# Patient Record
Sex: Female | Born: 1995 | Race: Black or African American | Hispanic: No | Marital: Single | Smoking: Never smoker
Health system: Southern US, Community
[De-identification: ages and names within clinical notes are randomized; demographics above are authoritative.]

## PROBLEM LIST (undated history)

## (undated) DIAGNOSIS — R7303 Prediabetes: Secondary | ICD-10-CM

## (undated) DIAGNOSIS — E282 Polycystic ovarian syndrome: Secondary | ICD-10-CM

## (undated) DIAGNOSIS — J302 Other seasonal allergic rhinitis: Secondary | ICD-10-CM

## (undated) HISTORY — DX: Prediabetes: R73.03

## (undated) HISTORY — PX: NO PAST SURGERIES: SHX2092

---
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
## 2017-05-21 ENCOUNTER — Ambulatory Visit (HOSPITAL_COMMUNITY)
Admission: EM | Admit: 2017-05-21 | Discharge: 2017-05-21 | Disposition: A | Discharge status: Home / Self Care | Payer: Medicaid Other | Attending: Internal Medicine | Admitting: Internal Medicine

## 2017-05-21 ENCOUNTER — Ambulatory Visit (INDEPENDENT_AMBULATORY_CARE_PROVIDER_SITE_OTHER): Payer: Medicaid Other

## 2017-05-21 ENCOUNTER — Encounter (HOSPITAL_COMMUNITY): Payer: Self-pay | Admitting: *Deleted

## 2017-05-21 ENCOUNTER — Other Ambulatory Visit: Payer: Self-pay

## 2017-05-21 DIAGNOSIS — Z3202 Encounter for pregnancy test, result negative: Secondary | ICD-10-CM

## 2017-05-21 DIAGNOSIS — J40 Bronchitis, not specified as acute or chronic: Secondary | ICD-10-CM | POA: Diagnosis not present

## 2017-05-21 LAB — POCT PREGNANCY, URINE: PREG TEST UR: NEGATIVE

## 2017-05-21 MED ORDER — FLUTICASONE PROPIONATE 50 MCG/ACT NA SUSP: 2.0000 | Freq: Every day | NASAL | 0 refills | Status: DC

## 2017-05-21 MED ORDER — PREDNISONE 20 MG PO TABS: 40.0000 mg | ORAL_TABLET | Freq: Every day | ORAL | 0 refills | Status: AC

## 2017-05-21 MED ORDER — CETIRIZINE-PSEUDOEPHEDRINE ER 5-120 MG PO TB12: 1.0000 | ORAL_TABLET | Freq: Every day | ORAL | 0 refills | Status: DC

## 2017-05-21 MED ORDER — AZITHROMYCIN 250 MG PO TABS: 250.0000 mg | ORAL_TABLET | Freq: Every day | ORAL | 0 refills | Status: DC

## 2017-05-21 NOTE — ED Notes (Signed)
Urine placed in lab 

## 2017-05-21 NOTE — Discharge Instructions (Signed)
Chest xray without pneumonia. Start azithromycin and prednisone as directed. Start flonase, zyrtec-D for nasal congestion. You can use over the counter nasal saline rinse such as neti pot for nasal congestion. Keep hydrated, your urine should be clear to pale yellow in color. Tylenol/motrin for fever and pain. Monitor for any worsening of symptoms, chest pain, shortness of breath, wheezing, swelling of the throat, follow up for reevaluation.

## 2017-05-21 NOTE — ED Provider Notes (Signed)
[facility]    CSN: [id number] Arrival date & time: 05/21/17  1520     History   Chief Complaint Chief Complaint  Patient presents with  . Cough  . Headache    HPI Miranda Fernandez is a 21 y.o. female.   21 year old female comes in for 2 month history of cough. States cough is worsening with intermittent shortness of breath and headache. She continues to have nasal congestion and rhinorrhea. She has had productive cough throughout the day, but worse at night. She states she has shortness of breath first thing in the morning, where she feels she cannot take deep breaths. She also has experienced dyspnea on exertion. Unknown wheezing. Denies fever, chills, night sweats. Never smoker, but was passive smoker for 15-16 years.        Past Medical History:  Diagnosis Date  . Seasonal allergies     There are no active problems to display for this patient.   History reviewed. No pertinent surgical history.  OB History    No data available       Home Medications    Prior to Admission medications   Medication Sig Start Date End Date Taking? Authorizing Provider  azithromycin (ZITHROMAX) 250 MG tablet Take 1 tablet (250 mg total) by mouth daily. Take first 2 tablets together, then 1 every day until finished. 05/21/17   Cathie HoopsYu, Amy V, PA-C  cetirizine-pseudoephedrine (ZYRTEC-D) 5-120 MG tablet Take 1 tablet by mouth daily. 05/21/17   Cathie HoopsYu, Amy V, PA-C  fluticasone (FLONASE) 50 MCG/ACT nasal spray Place 2 sprays into both nostrils daily. 05/21/17   Cathie HoopsYu, Amy V, PA-C  predniSONE (DELTASONE) 20 MG tablet Take 2 tablets (40 mg total) by mouth daily for 4 days. 05/21/17 05/25/17  Belinda FisherYu, Amy V, PA-C    Family History No family history on file.  Social History Social History   Tobacco Use  . Smoking status: Never Smoker  Substance Use Topics  . Alcohol use: No    Frequency: Never  . Drug use: No     Allergies   Other   Review of Systems Review of Systems  Reason  unable to perform ROS: See HPI as above.     Physical Exam Triage Vital Signs ED Triage Vitals [05/21/17 1619]  Enc Vitals Group     BP 126/66     Pulse Rate 66     Resp 18     Temp 98.7 F (37.1 C)     Temp Source Oral     SpO2 100 %     Weight      Height      Head Circumference      Peak Flow      Pain Score      Pain Loc      Pain Edu?      Excl. in GC?    No data found.  Updated Vital Signs BP 126/66   Pulse 66   Temp 98.7 F (37.1 C) (Oral)   Resp 18   LMP 05/02/2017 (Exact Date) Comment: pregnancy test prior to CXR, pregnancy test negative  SpO2 100%   Physical Exam  Constitutional: She is oriented to person, place, and time. She appears well-developed and well-nourished. No distress.  HENT:  Head: Normocephalic and atraumatic.  Right Ear: External ear and ear canal normal. Tympanic membrane is erythematous. Tympanic membrane is not bulging.  Left Ear: Tympanic membrane, external ear and ear canal normal. Tympanic membrane is not  erythematous and not bulging.  Nose: Mucosal edema and rhinorrhea present. Right sinus exhibits no maxillary sinus tenderness and no frontal sinus tenderness. Left sinus exhibits no maxillary sinus tenderness and no frontal sinus tenderness.  Mouth/Throat: Uvula is midline, oropharynx is clear and moist and mucous membranes are normal.  Eyes: Conjunctivae are normal. Pupils are equal, round, and reactive to light.  Neck: Normal range of motion. Neck supple.  Cardiovascular: Normal rate, regular rhythm and normal heart sounds. Exam reveals no gallop and no friction rub.  No murmur heard. Pulmonary/Chest: Effort normal and breath sounds normal. She has no decreased breath sounds. She has no wheezes. She has no rhonchi. She has no rales.  Lymphadenopathy:    She has no cervical adenopathy.  Neurological: She is alert and oriented to person, place, and time.  Skin: Skin is warm and dry.  Psychiatric: She has a normal mood and affect.  Her behavior is normal. Judgment normal.     UC Treatments / Results  Labs (all labs ordered are listed, but only abnormal results are displayed) Labs Reviewed  POCT PREGNANCY, URINE    EKG  EKG Interpretation None       Radiology Dg Chest 2 View  Result Date: 05/21/2017 CLINICAL DATA:  21 year old female with cough for 2 months, worsening over the last 2 days. EXAM: CHEST  2 VIEW COMPARISON:  None. FINDINGS: The heart size and mediastinal contours are within normal limits. Mild retrocardiac atelectasis. Both lungs are otherwise clear. The visualized skeletal structures are unremarkable. IMPRESSION: No active cardiopulmonary disease. Electronically Signed   By: Sande BrothersSerena  Chacko M.D.   On: 05/21/2017 17:56    Procedures Procedures (including critical care time)  Medications Ordered in UC Medications - No data to display   Initial Impression / Assessment and Plan / UC Course  I have reviewed the triage vital signs and the nursing notes.  Pertinent labs & imaging results that were available during my care of the patient were reviewed by me and considered in my medical decision making (see chart for details).    CXR negative for pneumonia. Given patient's passive smoking history, will have patient start azithromycin and prednisone. Other symptomatic treatment discussed. Return precautions given.   Final Clinical Impressions(s) / UC Diagnoses   Final diagnoses:  Bronchitis    ED Discharge Orders        Ordered    azithromycin (ZITHROMAX) 250 MG tablet  Daily     05/21/17 1809    predniSONE (DELTASONE) 20 MG tablet  Daily     05/21/17 1809    fluticasone (FLONASE) 50 MCG/ACT nasal spray  Daily     05/21/17 1810    cetirizine-pseudoephedrine (ZYRTEC-D) 5-120 MG tablet  Daily     05/21/17 1810        Belinda FisherYu, Amy V, PA-C 05/21/17 1823

## 2017-05-21 NOTE — ED Triage Notes (Signed)
C/O cough x 2 months.  C/O worsening cough with intermittent SOB and HA now.  Denies fevers.

## 2017-10-28 ENCOUNTER — Other Ambulatory Visit: Payer: Self-pay

## 2017-10-28 ENCOUNTER — Emergency Department (HOSPITAL_COMMUNITY)
Admission: EM | Admit: 2017-10-28 | Discharge: 2017-10-28 | Disposition: A | Discharge status: Home / Self Care | Payer: Medicaid Other | Attending: Emergency Medicine | Admitting: Emergency Medicine

## 2017-10-28 ENCOUNTER — Encounter (HOSPITAL_COMMUNITY): Payer: Self-pay | Admitting: Emergency Medicine

## 2017-10-28 DIAGNOSIS — Y929 Unspecified place or not applicable: Secondary | ICD-10-CM | POA: Diagnosis not present

## 2017-10-28 DIAGNOSIS — Y999 Unspecified external cause status: Secondary | ICD-10-CM | POA: Insufficient documentation

## 2017-10-28 DIAGNOSIS — T783XXA Angioneurotic edema, initial encounter: Secondary | ICD-10-CM

## 2017-10-28 DIAGNOSIS — X58XXXA Exposure to other specified factors, initial encounter: Secondary | ICD-10-CM | POA: Diagnosis not present

## 2017-10-28 DIAGNOSIS — Y939 Activity, unspecified: Secondary | ICD-10-CM | POA: Diagnosis not present

## 2017-10-28 DIAGNOSIS — J302 Other seasonal allergic rhinitis: Secondary | ICD-10-CM | POA: Insufficient documentation

## 2017-10-28 DIAGNOSIS — T7840XA Allergy, unspecified, initial encounter: Secondary | ICD-10-CM | POA: Diagnosis not present

## 2017-10-28 MED ORDER — METHYLPREDNISOLONE SODIUM SUCC 125 MG IJ SOLR
125.0000 mg | Freq: Once | INTRAMUSCULAR | Status: AC
Start: 2017-10-28 — End: 2017-10-28
  Administered 2017-10-28: 125 mg via INTRAVENOUS
  Filled 2017-10-28: qty 2

## 2017-10-28 MED ORDER — FAMOTIDINE IN NACL 20-0.9 MG/50ML-% IV SOLN
20.0000 mg | Freq: Once | INTRAVENOUS | Status: AC
  Administered 2017-10-28: 20 mg via INTRAVENOUS
  Filled 2017-10-28: qty 50

## 2017-10-28 MED ORDER — EPINEPHRINE 0.3 MG/0.3ML IJ SOAJ: 0.3000 mg | Freq: Once | INTRAMUSCULAR | 1 refills | Status: AC

## 2017-10-28 MED ORDER — PREDNISONE 10 MG (21) PO TBPK: ORAL_TABLET | ORAL | 0 refills | Status: DC

## 2017-10-28 MED ORDER — FAMOTIDINE 20 MG PO TABS: 20.0000 mg | ORAL_TABLET | Freq: Two times a day (BID) | ORAL | 0 refills | Status: DC

## 2017-10-28 MED ORDER — DIPHENHYDRAMINE HCL 50 MG/ML IJ SOLN
50.0000 mg | Freq: Once | INTRAMUSCULAR | Status: AC
  Administered 2017-10-28: 50 mg via INTRAVENOUS
  Filled 2017-10-28: qty 1

## 2017-10-28 NOTE — ED Triage Notes (Signed)
Pt reports she ate shrimp within past hr and her tongue started to swell. Mild swelling noted, when pt speaks it sounds like she has a "thick tongue" Resp WNL

## 2017-10-28 NOTE — Discharge Instructions (Addendum)
Please use Benadryl 50 mg every 8 hours for the next 2 days and then as needed.  If you develop severe lip or tongue swelling, difficulty breathing, wheezing, hives, please use your EpiPen and then call 911.   I recommend you avoid shellfish, shrimp in the future.

## 2017-10-28 NOTE — ED Provider Notes (Signed)
TIME SEEN: 3:32 AM  CHIEF COMPLAINT: Allergic reaction  HPI: Patient is a 22 year old female with no significant past medical history who presents to the emergency department with a possible allergic reaction.  States after eating shrimp tonight she felt like her tongue was swelling.  She states it is hard to talk and swallow.  No difficulty breathing.  No rash.  No other new exposures including soaps, lotions, detergents or medications.  Has never had this happen to her before.  ROS: See HPI Constitutional: no fever  Eyes: no drainage  ENT: no runny nose   Cardiovascular:  no chest pain  Resp: no SOB  GI: no vomiting GU: no dysuria Integumentary: no rash  Allergy: no hives  Musculoskeletal: no leg swelling  Neurological: no slurred speech ROS otherwise negative  PAST MEDICAL HISTORY/PAST SURGICAL HISTORY:  Past Medical History:  Diagnosis Date  . Seasonal allergies     MEDICATIONS:  Prior to Admission medications   Medication Sig Start Date End Date Taking? Authorizing Provider  azithromycin (ZITHROMAX) 250 MG tablet Take 1 tablet (250 mg total) by mouth daily. Take first 2 tablets together, then 1 every day until finished. 05/21/17   Cathie Hoops, Amy V, PA-C  cetirizine-pseudoephedrine (ZYRTEC-D) 5-120 MG tablet Take 1 tablet by mouth daily. 05/21/17   Cathie Hoops, Amy V, PA-C  fluticasone (FLONASE) 50 MCG/ACT nasal spray Place 2 sprays into both nostrils daily. 05/21/17   Belinda Fisher, PA-C    ALLERGIES:  Allergies  Allergen Reactions  . Other     kiwi    SOCIAL HISTORY:  Social History   Tobacco Use  . Smoking status: Never Smoker  . Smokeless tobacco: Never Used  Substance Use Topics  . Alcohol use: No    Frequency: Never    FAMILY HISTORY: No family history on file.  EXAM: BP 132/86 (BP Location: Right Arm)   Pulse 72   Temp 98.8 F (37.1 C) (Oral)   Resp 18   Ht  (1.676 m)   Wt 93 kg (205 lb)   SpO2 100%   BMI 33.09 kg/m  CONSTITUTIONAL: Alert and oriented and  responds appropriately to questions. Well-appearing; well-nourished HEAD: Normocephalic EYES: Conjunctivae clear, pupils appear equal, EOMI ENT: normal nose; moist mucous membranes, no lip swelling the patient does have some mild to moderate tongue swelling on examination that appears bilateral, posterior oropharynx appears normal without swelling, no uvular deviation or uvular swelling.  No tonsillar hypertrophy or exudate.  She does have slightly altered phonation from angioedema.  No stridor.  No trismus or drooling. NECK: Supple, no meningismus, no nuchal rigidity, no LAD  CARD: RRR; S1 and S2 appreciated; no murmurs, no clicks, no rubs, no gallops RESP: Normal chest excursion without splinting or tachypnea; breath sounds clear and equal bilaterally; no wheezes, no rhonchi, no rales, no hypoxia or respiratory distress, speaking full sentences ABD/GI: Normal bowel sounds; non-distended; soft, non-tender, no rebound, no guarding, no peritoneal signs, no hepatosplenomegaly BACK:  The back appears normal and is non-tender to palpation, there is no CVA tenderness EXT: Normal ROM in all joints; non-tender to palpation; no edema; normal capillary refill; no cyanosis, no calf tenderness or swelling    SKIN: Normal color for age and race; warm; no rash, no hives NEURO: Moves all extremities equally PSYCH: The patient's mood and manner are appropriate. Grooming and personal hygiene are appropriate.  MEDICAL DECISION MAKING: Patient here with tongue swelling.  It is mild to moderate in nature.  She states  she feels like it is improving.  No other symptoms of allergic reaction at this time.  We will start with IV Solu-Medrol, Benadryl, Pepcid.  Will monitor closely as she may need epinephrine if symptoms do not improve rapidly or worsen.  ED PROGRESS: Patient seems to be improving.  Will discharge home with prednisone taper.  Recommended continuing using Benadryl and Pepcid.  Discussed return precautions.   Recommend she avoid shellfish, shrimp.  Will discharge with EpiPen.  Discussed return precautions.   At this time, I do not feel there is any life-threatening condition present. I have reviewed and discussed all results (EKG, imaging, lab, urine as appropriate) and exam findings with patient/family. I have reviewed nursing notes and appropriate previous records.  I feel the patient is safe to be discharged home without further emergent workup and can continue workup as an outpatient as needed. Discussed usual and customary return precautions. Patient/family verbalize understanding and are comfortable with this plan.  Outpatient follow-up has been provided if needed. All questions have been answered.      Ward, Layla Maw, DO 10/28/17 0530

## 2018-09-16 ENCOUNTER — Emergency Department (HOSPITAL_COMMUNITY)
Admission: EM | Admit: 2018-09-16 | Discharge: 2018-09-16 | Disposition: A | Discharge status: Home / Self Care | Payer: No Typology Code available for payment source | Attending: Emergency Medicine | Admitting: Emergency Medicine

## 2018-09-16 ENCOUNTER — Emergency Department (HOSPITAL_COMMUNITY): Payer: No Typology Code available for payment source

## 2018-09-16 ENCOUNTER — Encounter (HOSPITAL_COMMUNITY): Payer: Self-pay | Admitting: Emergency Medicine

## 2018-09-16 DIAGNOSIS — Y999 Unspecified external cause status: Secondary | ICD-10-CM | POA: Insufficient documentation

## 2018-09-16 DIAGNOSIS — Y939 Activity, unspecified: Secondary | ICD-10-CM | POA: Insufficient documentation

## 2018-09-16 DIAGNOSIS — S29012A Strain of muscle and tendon of back wall of thorax, initial encounter: Secondary | ICD-10-CM | POA: Diagnosis not present

## 2018-09-16 DIAGNOSIS — S39012A Strain of muscle, fascia and tendon of lower back, initial encounter: Secondary | ICD-10-CM | POA: Diagnosis not present

## 2018-09-16 DIAGNOSIS — Y929 Unspecified place or not applicable: Secondary | ICD-10-CM | POA: Insufficient documentation

## 2018-09-16 DIAGNOSIS — S3992XA Unspecified injury of lower back, initial encounter: Secondary | ICD-10-CM | POA: Diagnosis present

## 2018-09-16 LAB — POC URINE PREG, ED: Preg Test, Ur: NEGATIVE

## 2018-09-16 MED ORDER — METHOCARBAMOL 500 MG PO TABS
500.0000 mg | ORAL_TABLET | Freq: Once | ORAL | Status: AC
  Administered 2018-09-16: 500 mg via ORAL
  Filled 2018-09-16: qty 1

## 2018-09-16 MED ORDER — HYDROCODONE-ACETAMINOPHEN 5-325 MG PO TABS: 1.0000 | ORAL_TABLET | Freq: Four times a day (QID) | ORAL | 0 refills | Status: DC | PRN

## 2018-09-16 MED ORDER — HYDROCODONE-ACETAMINOPHEN 5-325 MG PO TABS
2.0000 | ORAL_TABLET | Freq: Once | ORAL | Status: AC
  Administered 2018-09-16: 2 via ORAL
  Filled 2018-09-16: qty 2

## 2018-09-16 MED ORDER — KETOROLAC TROMETHAMINE 60 MG/2ML IM SOLN
60.0000 mg | Freq: Once | INTRAMUSCULAR | Status: AC
  Administered 2018-09-16: 60 mg via INTRAMUSCULAR
  Filled 2018-09-16: qty 2

## 2018-09-16 MED ORDER — IBUPROFEN 600 MG PO TABS: 600.0000 mg | ORAL_TABLET | Freq: Three times a day (TID) | ORAL | 0 refills | Status: DC | PRN

## 2018-09-16 MED ORDER — METHOCARBAMOL 500 MG PO TABS: 500.0000 mg | ORAL_TABLET | Freq: Three times a day (TID) | ORAL | 0 refills | Status: DC | PRN
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
## 2020-12-15 ENCOUNTER — Emergency Department (HOSPITAL_BASED_OUTPATIENT_CLINIC_OR_DEPARTMENT_OTHER): Payer: Commercial Managed Care - PPO

## 2020-12-15 ENCOUNTER — Emergency Department (HOSPITAL_BASED_OUTPATIENT_CLINIC_OR_DEPARTMENT_OTHER)
Admission: EM | Admit: 2020-12-15 | Discharge: 2020-12-15 | Disposition: A | Discharge status: Home / Self Care | Payer: Commercial Managed Care - PPO | Attending: Emergency Medicine | Admitting: Emergency Medicine

## 2020-12-15 ENCOUNTER — Other Ambulatory Visit: Payer: Self-pay

## 2020-12-15 ENCOUNTER — Encounter (HOSPITAL_BASED_OUTPATIENT_CLINIC_OR_DEPARTMENT_OTHER): Payer: Self-pay | Admitting: *Deleted

## 2020-12-15 DIAGNOSIS — W01198A Fall on same level from slipping, tripping and stumbling with subsequent striking against other object, initial encounter: Secondary | ICD-10-CM | POA: Insufficient documentation

## 2020-12-15 DIAGNOSIS — S29002A Unspecified injury of muscle and tendon of back wall of thorax, initial encounter: Secondary | ICD-10-CM | POA: Diagnosis present

## 2020-12-15 DIAGNOSIS — S300XXA Contusion of lower back and pelvis, initial encounter: Secondary | ICD-10-CM | POA: Diagnosis not present

## 2020-12-15 DIAGNOSIS — S20229A Contusion of unspecified back wall of thorax, initial encounter: Secondary | ICD-10-CM

## 2020-12-15 DIAGNOSIS — B353 Tinea pedis: Secondary | ICD-10-CM | POA: Insufficient documentation

## 2020-12-15 LAB — PREGNANCY, URINE: Preg Test, Ur: NEGATIVE

## 2020-12-15 MED ORDER — DICLOFENAC SODIUM 75 MG PO TBEC: 75.0000 mg | DELAYED_RELEASE_TABLET | Freq: Two times a day (BID) | ORAL | 0 refills | Status: DC

## 2020-12-15 NOTE — ED Triage Notes (Signed)
C/o fall x 3 days ago injuring coccyx
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
## 2020-12-15 NOTE — Discharge Instructions (Addendum)
Use Lamisil to dark areas on your toes.  Keep feet dry.

## 2021-07-15 ENCOUNTER — Other Ambulatory Visit: Payer: Self-pay

## 2021-07-15 ENCOUNTER — Emergency Department (HOSPITAL_BASED_OUTPATIENT_CLINIC_OR_DEPARTMENT_OTHER)
Admission: EM | Admit: 2021-07-15 | Discharge: 2021-07-15 | Disposition: A | Discharge status: Home / Self Care | Payer: BC Managed Care – PPO | Attending: Emergency Medicine | Admitting: Emergency Medicine

## 2021-07-15 ENCOUNTER — Encounter (HOSPITAL_BASED_OUTPATIENT_CLINIC_OR_DEPARTMENT_OTHER): Payer: Self-pay | Admitting: *Deleted

## 2021-07-15 DIAGNOSIS — M791 Myalgia, unspecified site: Secondary | ICD-10-CM | POA: Insufficient documentation

## 2021-07-15 DIAGNOSIS — Z043 Encounter for examination and observation following other accident: Secondary | ICD-10-CM | POA: Insufficient documentation

## 2021-07-15 HISTORY — DX: Polycystic ovarian syndrome: E28.2
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
## 2022-07-26 ENCOUNTER — Other Ambulatory Visit: Payer: Self-pay

## 2022-07-26 ENCOUNTER — Encounter (HOSPITAL_BASED_OUTPATIENT_CLINIC_OR_DEPARTMENT_OTHER): Payer: Self-pay

## 2022-07-26 ENCOUNTER — Emergency Department (HOSPITAL_BASED_OUTPATIENT_CLINIC_OR_DEPARTMENT_OTHER): Payer: Self-pay

## 2022-07-26 ENCOUNTER — Emergency Department (HOSPITAL_BASED_OUTPATIENT_CLINIC_OR_DEPARTMENT_OTHER)
Admission: EM | Admit: 2022-07-26 | Discharge: 2022-07-26 | Disposition: A | Discharge status: Home / Self Care | Payer: Self-pay | Attending: Emergency Medicine | Admitting: Emergency Medicine

## 2022-07-26 DIAGNOSIS — N12 Tubulo-interstitial nephritis, not specified as acute or chronic: Secondary | ICD-10-CM | POA: Insufficient documentation

## 2022-07-26 DIAGNOSIS — R160 Hepatomegaly, not elsewhere classified: Secondary | ICD-10-CM | POA: Insufficient documentation

## 2022-07-26 LAB — URINALYSIS, ROUTINE W REFLEX MICROSCOPIC
Bilirubin Urine: NEGATIVE
Glucose, UA: NEGATIVE mg/dL
Ketones, ur: NEGATIVE mg/dL
Nitrite: POSITIVE — AB
Protein, ur: 30 mg/dL — AB
Specific Gravity, Urine: 1.015 (ref 1.005–1.030)
pH: 5.5 (ref 5.0–8.0)

## 2022-07-26 LAB — CBC WITH DIFFERENTIAL/PLATELET
Abs Immature Granulocytes: 0.04 10*3/uL (ref 0.00–0.07)
Basophils Absolute: 0 10*3/uL (ref 0.0–0.1)
Basophils Relative: 0 %
Eosinophils Absolute: 0.1 10*3/uL (ref 0.0–0.5)
Eosinophils Relative: 1 %
HCT: 38 % (ref 36.0–46.0)
Hemoglobin: 12.5 g/dL (ref 12.0–15.0)
Immature Granulocytes: 0 %
Lymphocytes Relative: 17 %
Lymphs Abs: 2.1 10*3/uL (ref 0.7–4.0)
MCH: 24.7 pg — ABNORMAL LOW (ref 26.0–34.0)
MCHC: 32.9 g/dL (ref 30.0–36.0)
MCV: 75 fL — ABNORMAL LOW (ref 80.0–100.0)
Monocytes Absolute: 0.7 10*3/uL (ref 0.1–1.0)
Monocytes Relative: 5 %
Neutro Abs: 9.6 10*3/uL — ABNORMAL HIGH (ref 1.7–7.7)
Neutrophils Relative %: 77 %
Platelets: 389 10*3/uL (ref 150–400)
RBC: 5.07 MIL/uL (ref 3.87–5.11)
RDW: 13.9 % (ref 11.5–15.5)
WBC: 12.5 10*3/uL — ABNORMAL HIGH (ref 4.0–10.5)
nRBC: 0 % (ref 0.0–0.2)

## 2022-07-26 LAB — COMPREHENSIVE METABOLIC PANEL
ALT: 27 U/L (ref 0–44)
AST: 19 U/L (ref 15–41)
Albumin: 4 g/dL (ref 3.5–5.0)
Alkaline Phosphatase: 79 U/L (ref 38–126)
Anion gap: 8 (ref 5–15)
BUN: 10 mg/dL (ref 6–20)
CO2: 25 mmol/L (ref 22–32)
Calcium: 9 mg/dL (ref 8.9–10.3)
Chloride: 103 mmol/L (ref 98–111)
Creatinine, Ser: 0.72 mg/dL (ref 0.44–1.00)
GFR, Estimated: >60 mL/min (ref >60)
Glucose, Bld: 111 mg/dL — ABNORMAL HIGH (ref 70–99)
Potassium: 3.9 mmol/L (ref 3.5–5.1)
Sodium: 136 mmol/L (ref 135–145)
Total Bilirubin: 0.4 mg/dL (ref 0.3–1.2)
Total Protein: 8.1 g/dL (ref 6.5–8.1)

## 2022-07-26 LAB — LIPASE, BLOOD: Lipase: 26 U/L (ref 11–51)

## 2022-07-26 LAB — PREGNANCY, URINE: Preg Test, Ur: NEGATIVE

## 2022-07-26 LAB — URINALYSIS, MICROSCOPIC (REFLEX): WBC, UA: >50 WBC/hpf (ref 0–5)

## 2022-07-26 MED ORDER — CEFPODOXIME PROXETIL 200 MG PO TABS: 200.0000 mg | ORAL_TABLET | Freq: Two times a day (BID) | ORAL | 0 refills | Status: AC

## 2022-07-26 MED ORDER — CEFPODOXIME PROXETIL 200 MG PO TABS: 200.0000 mg | ORAL_TABLET | Freq: Two times a day (BID) | ORAL | 0 refills | Status: DC

## 2022-07-26 NOTE — ED Provider Notes (Signed)
[facility] Provider Note   CSN: [id number] Arrival date & time: 07/26/22  0907     History Chief Complaint  Patient presents with   Flank Pain   Dysuria    Miranda Fernandez is a 27 y.o. female PCOS and prediabetes presents the emergency room today for evaluation of dysuria for the past 3 days.  She reports he is having some urgency and frequency as well.  Some low back pain but no flank pain.  She has been having some right upper quadrant pain for the past 3 days as well.  No fevers, vaginal discharge, or vaginal bleeding.  No concern for STDs.  Denies any fevers, vomiting, diarrhea, constipation.  She reports that she has had some mild nausea.  No chest pain or shortness of breath.  No surgical history.  No daily medications.  No known drug allergies.  Almost daily marijuana use.   Flank Pain Associated symptoms include abdominal pain. Pertinent negatives include no chest pain and no shortness of breath.  Dysuria Associated symptoms: abdominal pain and nausea   Associated symptoms: no fever, no flank pain, no vaginal discharge and no vomiting        Home Medications Prior to Admission medications   Medication Sig Start Date End Date Taking? Authorizing Provider  ibuprofen (ADVIL,MOTRIN) 600 MG tablet Take 1 tablet (600 mg total) by mouth every 8 (eight) hours as needed for moderate pain. 09/16/18  Yes Duffy Bruce, MD  azithromycin (ZITHROMAX) 250 MG tablet Take 1 tablet (250 mg total) by mouth daily. Take first 2 tablets together, then 1 every day until finished. Patient not taking: Reported on 10/28/2017 05/21/17   Ok Edwards, PA-C  cetirizine-pseudoephedrine (ZYRTEC-D) 5-120 MG tablet Take 1 tablet by mouth daily. Patient not taking: Reported on 10/28/2017 05/21/17   Ok Edwards, PA-C  diclofenac (VOLTAREN) 75 MG EC tablet Take 1 tablet (75 mg total) by mouth 2 (two) times daily. 12/15/20   Fransico Meadow, PA-C  diclofenac (VOLTAREN) 75  MG EC tablet Take 1 tablet (75 mg total) by mouth 2 (two) times daily. 12/15/20   Fransico Meadow, PA-C  famotidine (PEPCID) 20 MG tablet Take 1 tablet (20 mg total) by mouth 2 (two) times daily. Patient not taking: Reported on 09/16/2018 10/28/17   Ward, Delice Bison, DO  fluticasone (FLONASE) 50 MCG/ACT nasal spray Place 2 sprays into both nostrils daily. Patient not taking: Reported on 10/28/2017 05/21/17   Ok Edwards, PA-C  HYDROcodone-acetaminophen (NORCO/VICODIN) 5-325 MG tablet Take 1-2 tablets by mouth every 6 (six) hours as needed for severe pain. 09/16/18   Duffy Bruce, MD  methocarbamol (ROBAXIN) 500 MG tablet Take 1 tablet (500 mg total) by mouth every 8 (eight) hours as needed for muscle spasms. 09/16/18   Duffy Bruce, MD  predniSONE (STERAPRED UNI-PAK 21 TAB) 10 MG (21) TBPK tablet Take as directed Patient not taking: Reported on 09/16/2018 10/28/17   Ward, Delice Bison, DO      Allergies    Other    Review of Systems   Review of Systems  Constitutional:  Negative for chills and fever.  Respiratory:  Negative for shortness of breath.   Cardiovascular:  Negative for chest pain.  Gastrointestinal:  Positive for abdominal pain and nausea. Negative for blood in stool, constipation, diarrhea and vomiting.  Genitourinary:  Positive for dysuria, frequency and urgency. Negative for flank pain, hematuria, vaginal bleeding and vaginal discharge.  Musculoskeletal:  Positive  for back pain.    Physical Exam Updated Vital Signs BP 130/80 (BP Location: Left Arm)   Pulse 100   Temp 99.3 F (37.4 C) (Oral)   Resp 17   Ht 5\' 6"  (1.676 m)   Wt (!) 138.8 kg   SpO2 99%   BMI 49.39 kg/m  Physical Exam Vitals and nursing note reviewed.  Constitutional:      General: She is not in acute distress.    Appearance: She is not ill-appearing or toxic-appearing.  HENT:     Mouth/Throat:     Mouth: Mucous membranes are moist.  Cardiovascular:     Rate and Rhythm: Normal rate.  Pulmonary:      Effort: Pulmonary effort is normal. No respiratory distress.     Breath sounds: Normal breath sounds.  Abdominal:     Palpations: Abdomen is soft.     Tenderness: There is abdominal tenderness. There is no right CVA tenderness, left CVA tenderness, guarding or rebound.     Comments: Abdominal exam limited secondary to body habitus.  Some right upper quadrant and epigastric tenderness palpation without any guarding or rebound.  Abdomen is soft.  No lower abdominal tenderness palpation.  Normal active bowel sounds.  Skin:    General: Skin is warm and dry.  Neurological:     Mental Status: She is alert.     ED Results / Procedures / Treatments   Labs (all labs ordered are listed, but only abnormal results are displayed) Labs Reviewed  URINALYSIS, ROUTINE W REFLEX MICROSCOPIC - Abnormal; Notable for the following components:      Result Value   APPearance HAZY (*)    Hgb urine dipstick MODERATE (*)    Protein, ur 30 (*)    Nitrite POSITIVE (*)    Leukocytes,Ua MODERATE (*)    All other components within normal limits  URINALYSIS, MICROSCOPIC (REFLEX) - Abnormal; Notable for the following components:   Bacteria, UA MANY (*)    All other components within normal limits  CBC WITH DIFFERENTIAL/PLATELET - Abnormal; Notable for the following components:   WBC 12.5 (*)    MCV 75.0 (*)    MCH 24.7 (*)    Neutro Abs 9.6 (*)    All other components within normal limits  COMPREHENSIVE METABOLIC PANEL - Abnormal; Notable for the following components:   Glucose, Bld 111 (*)    All other components within normal limits  PREGNANCY, URINE  LIPASE, BLOOD  URINALYSIS, W/ REFLEX TO CULTURE (INFECTION SUSPECTED)    EKG None  Radiology CT Renal Stone Study  Result Date: 07/26/2022 CLINICAL DATA:  Right abdominal and flank pain. Kidney stones suspected. EXAM: CT ABDOMEN AND PELVIS WITHOUT CONTRAST TECHNIQUE: Multidetector CT imaging of the abdomen and pelvis was performed following the standard  protocol without IV contrast. RADIATION DOSE REDUCTION: This exam was performed according to the departmental dose-optimization program which includes automated exposure control, adjustment of the mA and/or kV according to patient size and/or use of iterative reconstruction technique. COMPARISON:  None Available. FINDINGS: Lower chest: Unremarkable. Hepatobiliary: The liver shows diffusely decreased attenuation suggesting fat deposition. Craniocaudal length of the liver is 24 cm. No suspicious focal abnormality in the liver on this study without intravenous contrast. There is no evidence for gallstones, gallbladder wall thickening, or pericholecystic fluid. No intrahepatic or extrahepatic biliary dilation. Pancreas: No focal mass lesion. No dilatation of the main duct. No intraparenchymal cyst. No peripancreatic edema. Spleen: No splenomegaly. No focal mass lesion. Adrenals/Urinary Tract: No adrenal  nodule or mass. Kidneys unremarkable. No evidence for hydroureter. The urinary bladder appears normal for the degree of distention. Stomach/Bowel: Stomach is unremarkable. No gastric wall thickening. No evidence of outlet obstruction. Duodenum is normally positioned as is the ligament of Treitz. No small bowel wall thickening. No small bowel dilatation. The terminal ileum is normal. The appendix is normal. No gross colonic mass. No colonic wall thickening. Vascular/Lymphatic: No abdominal aortic aneurysm. No abdominal aortic atherosclerotic calcification. There is no gastrohepatic or hepatoduodenal ligament lymphadenopathy. No retroperitoneal or mesenteric lymphadenopathy. No pelvic sidewall lymphadenopathy. Reproductive: Unremarkable. Other: No intraperitoneal free fluid. Musculoskeletal: No worrisome lytic or sclerotic osseous abnormality. IMPRESSION: 1. No acute findings in the abdomen or pelvis. Specifically, no findings to explain the patient's history of right flank pain. 2. Hepatic steatosis with hepatomegaly.  Electronically Signed   By: Kennith Center M.D.   On: 07/26/2022 11:38    Procedures Procedures    Medications Ordered in ED Medications - No data to display  ED Course/ Medical Decision Making/ A&P                           Medical Decision Making Amount and/or Complexity of Data Reviewed Labs: ordered. Radiology: ordered.   27 year old female presents the emergency room today for evaluation of dysuria with urgency and frequency and right upper quadrant abdominal pain.  Differential diagnosis includes was not limited to AAA, mesenteric ischemia, appendicitis, diverticulitis, DKA, gastroenteritis, nephrolithiasis, pancreatitis, constipation, UTI, bowel obstruction, biliary disease, IBD, PUD, hepatitis, ectopic pregnancy, ovarian torsion, PID.  Vital signs are unremarkable.  Patient's blood pressure 130/80, afebrile, borderline tachycardia however was 88 when I was in the room, satting well on room air without any increased work of breathing.  Physical exam as noted above.  Urine and urine Prag ordered in triage.  I added on additional labs given the patient's upper abdominal pain.  I independently reviewed and interpreted the patient's labs.  Pregnancy test is negative.  Urinalysis shows hazy urine with mod amount of hemoglobin, 30 protein, she is nitrite positive with moderate amount leukocytes.  6-10 red blood cells seen but greater than 50 white blood cells with many bacteria present as well as white blood cells present.  Will add on urine culture to test for antibiotic susceptibility.  CBC shows mildly increased white blood cell count of 12.5 with a left shift.  Normal platelets.  No anemia..  CMP shows mildly increased glucose at 111 however no other electrolyte or LFT abnormalities.  Lipase within normal limits.  CT renal shows : 1. No acute findings in the abdomen or pelvis. Specifically, no findings to explain the patient's history of right flank pain. 2. Hepatic steatosis with  hepatomegaly.   Unsure what is causing the patient's right upper quadrant pain as her gallbladder does not show any signs of gallstones, gallbladder wall thickening, or pericholecystic fluid.  There is no intrahepatic or extrahepatic biliary dilation there is no focal mass lesion seen to the pancreas or dilation of the main duct.  There is no peripancreatic edema.  Spleen appears normal as well.  She could be having some referred pain.  Her image does show an enlarged liver with fatty liver however her LFTs are normal.  Will have her follow-up with her primary care doctor.  In the meantime, we will treat her for urinary tract infection.  I do not think this patient meets any criteria for admission given her stable vitals, reassuring physical  exam, and lab values.  Given that she does have some upper abdominal pain but no flank pain, we will treat her with longer course to cover for any pyelonephritis given that she has had a history of in the past.  On previous chart evaluation, it appears that she grew out E. coli that was settable to a numerous amount of things.  Will place her on cefpodoxime.  I discussed the patient's lab and imaging results with her at bedside.  We discussed the need to follow-up with her primary care doctor due to her enlarged liver.  Discussed antibiotic choices and using Azo for pain relief.  We discussed return precautions red flag symptoms.  Patient verbalized understanding and agrees to the plan.  Patient is stable being discharged home in good condition.   Final Clinical Impression(s) / ED Diagnoses Final diagnoses:  Enlarged liver  Pyelonephritis    Rx / DC Orders ED Discharge Orders          Ordered    cefpodoxime (VANTIN) 200 MG tablet  2 times daily        07/26/22 1246              Sherrell Puller, PA-C 07/26/22 [place], [place], DO 07/26/22 1405

## 2022-07-26 NOTE — ED Triage Notes (Signed)
In for eval of possible kidney infection. Right flank and RLQ abd pain onset Friday. Dysuria.

## 2022-07-26 NOTE — Discharge Instructions (Signed)
You were seen in the ER today for evaluation of your urinary symptoms.  Your CT showed an enlarged liver however does not show any signs of kidney infection or kidney stone.  However since you are having some upper pain, we will treat you for pyelonephritis.  You will take cefpodoxime twice a day for the next 10 days.  Also recommend using Azo urinary relief for the first 2 days to help with symptoms.  I would like for you to follow-up with her primary care doctor for evaluation of your enlarged liver seen on CT renal.  Please stay well-hydrated drinking plenty of fluids, mainly water. If you have any concerns, new or worsening symptoms, please return to the nearest emergency department for reevaluation.  Contact a doctor if: You do not feel better after 2 days. Your symptoms get worse. You have a fever or chills. You cannot take your medicine. Get help right away if: You vomit each time that you eat or drink. You have very bad pain in your back or side. You are very weak, or you faint.

## 2023-09-13 ENCOUNTER — Emergency Department (HOSPITAL_BASED_OUTPATIENT_CLINIC_OR_DEPARTMENT_OTHER)
Admission: EM | Admit: 2023-09-13 | Discharge: 2023-09-13 | Disposition: A | Discharge status: Home / Self Care | Payer: Self-pay | Attending: Emergency Medicine | Admitting: Emergency Medicine

## 2023-09-13 ENCOUNTER — Other Ambulatory Visit: Payer: Self-pay

## 2023-09-13 ENCOUNTER — Encounter (HOSPITAL_BASED_OUTPATIENT_CLINIC_OR_DEPARTMENT_OTHER): Payer: Self-pay

## 2023-09-13 DIAGNOSIS — L732 Hidradenitis suppurativa: Secondary | ICD-10-CM

## 2023-09-13 MED ORDER — DOXYCYCLINE HYCLATE 100 MG PO CAPS: 100.0000 mg | ORAL_CAPSULE | Freq: Two times a day (BID) | ORAL | 0 refills | Status: DC

## 2023-09-13 NOTE — Discharge Instructions (Signed)
 Your evaluated in the emergency room for concern for possible cyst or abscess.  Your exam is consistent with hidradenitis suppurativa.  A prescription for doxycycline was sent into your pharmacy.  You additionally provided a referral for dermatology.  Please call make an appointment at your earliest convenience.  If you experience any new or worsening symptoms including fevers, chills, worsening pain and drainage please return to the emergency room.

## 2023-09-13 NOTE — ED Triage Notes (Signed)
 Patient arrives [facility] with complaints of cyst/abscess to a groin x1 week. Patient states increased discomfort with ambulation.

## 2023-09-13 NOTE — ED Provider Notes (Signed)
 [place] [facility] Provider Note   CSN: [id number] Arrival date & time: 09/13/23  1304     History  Chief Complaint  Patient presents with   Abscess    groin    Miranda Fernandez is a 28 y.o. female last presents with complaints of cyst/abscess in her groin for the past week.  States she has had this before and it tends to come every couple months.  Describes pain with ambulation.  No reported fevers or chills.  No reported drainage.  Feels that her symptoms are improving.   Abscess Associated symptoms: no fever       Past Medical History:  Diagnosis Date   PCOS (polycystic ovarian syndrome)    Seasonal allergies      Home Medications Prior to Admission medications   Medication Sig Start Date End Date Taking? Authorizing Provider  doxycycline (VIBRAMYCIN) 100 MG capsule Take 1 capsule (100 mg total) by mouth 2 (two) times daily. 09/13/23  Yes Halford Decamp, PA-C  HYDROcodone-acetaminophen (NORCO/VICODIN) 5-325 MG tablet Take 1-2 tablets by mouth every 6 (six) hours as needed for severe pain. 09/16/18   Shaune Pollack, MD  ibuprofen (ADVIL,MOTRIN) 600 MG tablet Take 1 tablet (600 mg total) by mouth every 8 (eight) hours as needed for moderate pain. 09/16/18   Shaune Pollack, MD      Allergies    Other and Levonorgestrel-ethinyl estrad    Review of Systems   Review of Systems  Constitutional:  Negative for fever.    Physical Exam Updated Vital Signs BP (!) 134/95 (BP Location: Left Arm)   Pulse 87   Temp 98.4 F (36.9 C)   Resp 18   Ht 5\' 7"  (1.702 m)   Wt 116.1 kg   LMP 09/03/2023   SpO2 100%   BMI 40.10 kg/m  Physical Exam Vitals and nursing note reviewed.  Constitutional:      General: She is not in acute distress.    Appearance: She is well-developed.  HENT:     Head: Normocephalic and atraumatic.  Eyes:     Conjunctiva/sclera: Conjunctivae normal.  Cardiovascular:     Rate and Rhythm: Normal rate and  regular rhythm.     Heart sounds: No murmur heard. Pulmonary:     Effort: Pulmonary effort is normal. No respiratory distress.     Breath sounds: Normal breath sounds.  Abdominal:     Palpations: Abdomen is soft.     Tenderness: There is no abdominal tenderness.  Genitourinary:    Comments: Small 1 cm area of induration along the suprapubic region.  No overlying erythema, warmth or fluctuance.  Area is tender to palpation Musculoskeletal:        General: No swelling.     Cervical back: Neck supple.  Skin:    General: Skin is warm and dry.     Capillary Refill: Capillary refill takes less than 2 seconds.  Neurological:     Mental Status: She is alert.  Psychiatric:        Mood and Affect: Mood normal.     ED Results / Procedures / Treatments   Labs (all labs ordered are listed, but only abnormal results are displayed) Labs Reviewed - No data to display  EKG None  Radiology No results found.  Procedures Procedures    Medications Ordered in ED Medications - No data to display  ED Course/ Medical Decision Making/ A&P  Medical Decision Making  This patient presents to the ED with chief complaint(s) of cyst/abscess.  The complaint involves an extensive differential diagnosis and also carries with it a high risk of complications and morbidity.   pertinent past medical history as listed in HPI  The differential diagnosis includes  Bartholin gland abscess, simple cyst, cellulitis, folliculitis, abscess, hidradenitis  Additional history obtained: Records reviewed Care Everywhere/External Records  Initial Assessment:   Afebrile, nontoxic-appearing patient presenting with concern for cyst/abscess.  She has had this numerous times that comes and goes for several months now.  On exam there is a small area of induration in the suprapubic region that is tender to palpation without overlying erythema, warmth or fluctuance.  She has no systemic  symptoms.  She is afebrile.  POCUS demonstrates very small less than 1 cm fluid collection.  Low suspicion for cellulitis.  Overall most concerning for hidradenitis suppurativa.   independent ECG interpretation:  none  Independent labs interpretation:  The following labs were independently interpreted:  none  Independent visualization and interpretation of imaging: none  Treatment and Reassessment: none  Consultations obtained:   none  Disposition:   Patient will be discharged home.  Short prescription for Doxy sent into pharmacy.  Provided dermatology follow-up. The patient has been appropriately medically screened and/or stabilized in the ED. I have low suspicion for any other emergent medical condition which would require further screening, evaluation or treatment in the ED or require inpatient management. At time of discharge the patient is hemodynamically stable and in no acute distress. I have discussed work-up results and diagnosis with patient and answered all questions. Patient is agreeable with discharge plan. We discussed strict return precautions for returning to the emergency department and they verbalized understanding.     Social Determinants of Health:   Patient's impaired access to primary care  increases the complexity of managing their presentation  This note was dictated with voice recognition software.  Despite best efforts at proofreading, errors may have occurred which can change the documentation meaning.          Final Clinical Impression(s) / ED Diagnoses Final diagnoses:  Hydradenitis    Rx / DC Orders ED Discharge Orders          Ordered    doxycycline (VIBRAMYCIN) 100 MG capsule  2 times daily        09/13/23 1659              Halford Decamp, PA-C 09/13/23 1700    Sloan Leiter, DO 09/18/23 [id number]

## 2024-04-14 ENCOUNTER — Other Ambulatory Visit: Payer: Self-pay

## 2024-04-14 ENCOUNTER — Encounter (HOSPITAL_BASED_OUTPATIENT_CLINIC_OR_DEPARTMENT_OTHER): Payer: Self-pay

## 2024-04-14 ENCOUNTER — Inpatient Hospital Stay (HOSPITAL_BASED_OUTPATIENT_CLINIC_OR_DEPARTMENT_OTHER)
Admission: AD | Admit: 2024-04-14 | Discharge: 2024-04-15 | Disposition: A | Discharge status: Home / Self Care | Attending: Obstetrics and Gynecology | Admitting: Obstetrics and Gynecology

## 2024-04-14 DIAGNOSIS — R10A1 Flank pain, right side: Secondary | ICD-10-CM | POA: Insufficient documentation

## 2024-04-14 DIAGNOSIS — Z3A22 22 weeks gestation of pregnancy: Secondary | ICD-10-CM | POA: Insufficient documentation

## 2024-04-14 DIAGNOSIS — E282 Polycystic ovarian syndrome: Secondary | ICD-10-CM | POA: Insufficient documentation

## 2024-04-14 DIAGNOSIS — R7303 Prediabetes: Secondary | ICD-10-CM | POA: Insufficient documentation

## 2024-04-14 DIAGNOSIS — O0932 Supervision of pregnancy with insufficient antenatal care, second trimester: Secondary | ICD-10-CM | POA: Diagnosis not present

## 2024-04-14 DIAGNOSIS — O99891 Other specified diseases and conditions complicating pregnancy: Secondary | ICD-10-CM | POA: Diagnosis not present

## 2024-04-14 DIAGNOSIS — O219 Vomiting of pregnancy, unspecified: Secondary | ICD-10-CM | POA: Insufficient documentation

## 2024-04-14 DIAGNOSIS — O99282 Endocrine, nutritional and metabolic diseases complicating pregnancy, second trimester: Secondary | ICD-10-CM | POA: Insufficient documentation

## 2024-04-14 DIAGNOSIS — Z3201 Encounter for pregnancy test, result positive: Secondary | ICD-10-CM | POA: Insufficient documentation

## 2024-04-14 DIAGNOSIS — O26899 Other specified pregnancy related conditions, unspecified trimester: Secondary | ICD-10-CM

## 2024-04-14 DIAGNOSIS — Z3687 Encounter for antenatal screening for uncertain dates: Secondary | ICD-10-CM | POA: Diagnosis not present

## 2024-04-14 DIAGNOSIS — Z3490 Encounter for supervision of normal pregnancy, unspecified, unspecified trimester: Secondary | ICD-10-CM

## 2024-04-14 DIAGNOSIS — R0981 Nasal congestion: Secondary | ICD-10-CM | POA: Diagnosis present

## 2024-04-14 LAB — COMPREHENSIVE METABOLIC PANEL WITH GFR
ALT: 9 U/L (ref 0–44)
AST: 13 U/L — ABNORMAL LOW (ref 15–41)
Albumin: 4 g/dL (ref 3.5–5.0)
Alkaline Phosphatase: 53 U/L (ref 38–126)
Anion gap: 12 (ref 5–15)
BUN: 6 mg/dL (ref 6–20)
CO2: 20 mmol/L — ABNORMAL LOW (ref 22–32)
Calcium: 8.9 mg/dL (ref 8.9–10.3)
Chloride: 106 mmol/L (ref 98–111)
Creatinine, Ser: 0.48 mg/dL (ref 0.44–1.00)
GFR, Estimated: >60 mL/min (ref >60)
Glucose, Bld: 96 mg/dL (ref 70–99)
Potassium: 3.9 mmol/L (ref 3.5–5.1)
Sodium: 138 mmol/L (ref 135–145)
Total Bilirubin: <0.2 mg/dL (ref 0.0–1.2)
Total Protein: 6.7 g/dL (ref 6.5–8.1)

## 2024-04-14 LAB — CBC
HCT: 31.1 % — ABNORMAL LOW (ref 36.0–46.0)
Hemoglobin: 10.6 g/dL — ABNORMAL LOW (ref 12.0–15.0)
MCH: 27 pg (ref 26.0–34.0)
MCHC: 34.1 g/dL (ref 30.0–36.0)
MCV: 79.1 fL — ABNORMAL LOW (ref 80.0–100.0)
Platelets: 324 K/uL (ref 150–400)
RBC: 3.93 MIL/uL (ref 3.87–5.11)
RDW: 13.2 % (ref 11.5–15.5)
WBC: 14.1 K/uL — ABNORMAL HIGH (ref 4.0–10.5)
nRBC: 0 % (ref 0.0–0.2)

## 2024-04-14 LAB — URINALYSIS, ROUTINE W REFLEX MICROSCOPIC
Bilirubin Urine: NEGATIVE
Glucose, UA: NEGATIVE mg/dL
Ketones, ur: NEGATIVE mg/dL
Nitrite: POSITIVE — AB
Protein, ur: NEGATIVE mg/dL
Specific Gravity, Urine: 1.025 (ref 1.005–1.030)
pH: 5.5 (ref 5.0–8.0)

## 2024-04-14 LAB — RESP PANEL BY RT-PCR (RSV, FLU A&B, COVID)  RVPGX2
Influenza A by PCR: NEGATIVE
Influenza B by PCR: NEGATIVE
Resp Syncytial Virus by PCR: NEGATIVE
SARS Coronavirus 2 by RT PCR: NEGATIVE

## 2024-04-14 LAB — LIPASE, BLOOD: Lipase: 34 U/L (ref 11–51)

## 2024-04-14 LAB — URINALYSIS, MICROSCOPIC (REFLEX)

## 2024-04-14 LAB — PREGNANCY, URINE: Preg Test, Ur: POSITIVE — AB

## 2024-04-14 NOTE — MAU Provider Note (Signed)
 History     CSN: [id number]  Arrival date and time: 04/14/24 2105   Event Date/Time   First Provider Initiated Contact with Patient 04/14/24 2350      Chief Complaint  Patient presents with   Nausea   HPI Miranda Fernandez is a 28 y.o. year old G1P0 female at Unknown weeks** gestation who was instructed to come to MAU for an U/S. She presented to the [facility] ED reporting abdominal pain and vomiting. She reports the abdominal pain stopped 2 weeks ago, but the vomiting continued. She reports she is only able to eat & drink a limited amount of foods. She found out she was pregnant today [employer].  She reports unknown LMP.  She denies any vaginal bleeding or discharge.   **Per report from Dr. Erik the PA at [facility] reported fundal height to be about 22 cm. OB History     Gravida  1   Para      Term      Preterm      AB      Living         SAB      IAB      Ectopic      Multiple      Live Births              Past Medical History:  Diagnosis Date   PCOS (polycystic ovarian syndrome)    Seasonal allergies     History reviewed. No pertinent surgical history.  History reviewed. No pertinent family history.  Social History   Tobacco Use   Smoking status: Never   Smokeless tobacco: Never  Vaping Use   Vaping status: Never Used  Substance Use Topics   Alcohol use: No   Drug use: Yes    Types: Marijuana    Allergies:  Allergies  Allergen Reactions   Other     kiwi   Levonorgestrel-Ethinyl Estrad Itching and Rash    Medications Prior to Admission  Medication Sig Dispense Refill Last Dose/Taking   doxycycline  (VIBRAMYCIN ) 100 MG capsule Take 1 capsule (100 mg total) by mouth 2 (two) times daily. 14 capsule 0    HYDROcodone -acetaminophen  (NORCO/VICODIN) 5-325 MG tablet Take 1-2 tablets by mouth every 6 (six) hours as needed for severe pain. 10 tablet 0    ibuprofen  (ADVIL ,MOTRIN ) 600 MG tablet Take 1 tablet (600 mg total)  by mouth every 8 (eight) hours as needed for moderate pain. 30 tablet 0     Review of Systems  Constitutional: Negative.   HENT: Negative.    Eyes: Negative.   Respiratory: Negative.    Cardiovascular: Negative.   Gastrointestinal:  Positive for nausea and vomiting.  Endocrine: Negative.   Genitourinary: Negative.   Musculoskeletal: Negative.   Skin: Negative.   Allergic/Immunologic: Negative.   Neurological: Negative.   Hematological: Negative.   Psychiatric/Behavioral: Negative.     Physical Exam   Blood pressure 130/78, pulse 61, temperature 98.5 F (36.9 C), temperature source Oral, resp. rate 18, height 5' 7 (1.702 m), weight 105.8 kg, SpO2 100%.  Physical Exam Vitals and nursing note reviewed.  Constitutional:      Appearance: Normal appearance. She is obese.  Cardiovascular:     Rate and Rhythm: Normal rate.  Pulmonary:     Effort: Pulmonary effort is normal.  Musculoskeletal:        General: Normal range of motion.  Neurological:     Mental Status: She is alert  and oriented to person, place, and time.  Psychiatric:        Mood and Affect: Mood normal.        Behavior: Behavior normal.        Thought Content: Thought content normal.        Judgment: Judgment normal.    FHT audible with doppler, but not sustained long enough for a reading on the doppler  MAU Course  Procedures  MDM OB MFM Limited U/S  Labs performed at [facility]: Results for orders placed or performed during the hospital encounter of 04/14/24 (from the past 24 hours)  Resp panel by RT-PCR (RSV, Flu A&B, Covid) Anterior Nasal Swab     Status: None   Collection Time: 04/14/24  9:23 PM   Specimen: Anterior Nasal Swab  Result Value Ref Range   SARS Coronavirus 2 by RT PCR NEGATIVE NEGATIVE   Influenza A by PCR NEGATIVE NEGATIVE   Influenza B by PCR NEGATIVE NEGATIVE   Resp Syncytial Virus by PCR NEGATIVE NEGATIVE  Lipase, blood     Status: None   Collection Time: 04/14/24  10:08 PM  Result Value Ref Range   Lipase 34 11 - 51 U/L  Comprehensive metabolic panel     Status: Abnormal   Collection Time: 04/14/24 10:08 PM  Result Value Ref Range   Sodium 138 135 - 145 mmol/L   Potassium 3.9 3.5 - 5.1 mmol/L   Chloride 106 98 - 111 mmol/L   CO2 20 (L) 22 - 32 mmol/L   Glucose, Bld 96 70 - 99 mg/dL   BUN 6 6 - 20 mg/dL   Creatinine, Ser 9.51 0.44 - 1.00 mg/dL   Calcium 8.9 8.9 - 89.6 mg/dL   Total Protein 6.7 6.5 - 8.1 g/dL   Albumin 4.0 3.5 - 5.0 g/dL   AST 13 (L) 15 - 41 U/L   ALT 9 0 - 44 U/L   Alkaline Phosphatase 53 38 - 126 U/L   Total Bilirubin <0.2 0.0 - 1.2 mg/dL   GFR, Estimated >39 >39 mL/min   Anion gap 12 5 - 15  CBC     Status: Abnormal   Collection Time: 04/14/24 10:08 PM  Result Value Ref Range   WBC 14.1 (H) 4.0 - 10.5 K/uL   RBC 3.93 3.87 - 5.11 MIL/uL   Hemoglobin 10.6 (L) 12.0 - 15.0 g/dL   HCT 68.8 (L) 63.9 - 53.9 %   MCV 79.1 (L) 80.0 - 100.0 fL   MCH 27.0 26.0 - 34.0 pg   MCHC 34.1 30.0 - 36.0 g/dL   RDW 86.7 88.4 - 84.4 %   Platelets 324 150 - 400 K/uL   nRBC 0.0 0.0 - 0.2 %  Urinalysis, Routine w reflex microscopic -Urine, Clean Catch     Status: Abnormal   Collection Time: 04/14/24 10:08 PM  Result Value Ref Range   Color, Urine YELLOW YELLOW   APPearance CLEAR CLEAR   Specific Gravity, Urine 1.025 1.005 - 1.030   pH 5.5 5.0 - 8.0   Glucose, UA NEGATIVE NEGATIVE mg/dL   Hgb urine dipstick TRACE (A) NEGATIVE   Bilirubin Urine NEGATIVE NEGATIVE   Ketones, ur NEGATIVE NEGATIVE mg/dL   Protein, ur NEGATIVE NEGATIVE mg/dL   Nitrite POSITIVE (A) NEGATIVE   Leukocytes,Ua SMALL (A) NEGATIVE  Pregnancy, urine     Status: Abnormal   Collection Time: 04/14/24 10:08 PM  Result Value Ref Range   Preg Test, Ur POSITIVE (A) NEGATIVE  Urinalysis,  Microscopic (reflex)     Status: Abnormal   Collection Time: 04/14/24 10:08 PM  Result Value Ref Range   RBC / HPF 0-5 0 - 5 RBC/hpf   WBC, UA 11-20 0 - 5 WBC/hpf   Bacteria, UA MANY  (A) NONE SEEN   Squamous Epithelial / HPF 0-5 0 - 5 /HPF    US  MFM OB COMP + 14 WK Result Date: 04/15/2024 ----------------------------------------------------------------------  OBSTETRICS REPORT                        (Signed Final 04/15/2024 09:55 am) ---------------------------------------------------------------------- Patient Info  ID #:       [id number]                          D.O.B.:  1995/12/25 (28 yrs)(F)  Name:       Miranda Fernandez                  Visit Date: 04/15/2024 12:42 am ---------------------------------------------------------------------- Performed By  Attending:        Steffan Keys MD         Referred By:       [facility] MAU/Triage  Performed By:     Nat GORMAN Plant     [facility] ---------------------------------------------------------------------- Orders  #  Description                           Code        Ordered By  1  US  MFM OB COMP + 14 WK                23194.98    Shaquia Berkley ----------------------------------------------------------------------  #  Order #                     Accession #                Episode #  1  [id number]                   [id number]                 [id number] ---------------------------------------------------------------------- Indications  Insufficient Prenatal Care (None)               O09.30  [redacted] weeks gestation of pregnancy                 Z3A.22  Encounter for uncertain dates                   Z36.87 ---------------------------------------------------------------------- Fetal Evaluation  Num Of Fetuses:          1  Fetal Heart Rate(bpm):   132  Cardiac Activity:        Observed  Presentation:            Cephalic  Placenta:                Anterior  P. Cord Insertion:       Visualized  Amniotic Fluid  AFI FV:      Within normal limits                              Largest Pocket(cm)                              6.1  ---------------------------------------------------------------------- Biometry  BPD:      55.1  mm     G. Age:  22w 6d         55  %    CI:          77.8  %    70 - 86                                                          FL/HC:       18.2  %    19.2 - 20.8  HC:      197.7  mm     G. Age:  22w 0d         15  %    HC/AC:       1.05       1.05 - 1.21  AC:      188.9  mm     G. Age:  23w 5d         76  %    FL/BPD:      65.3  %    71 - 87  FL:         36  mm     G. Age:  21w 3d          9  %    FL/AC:       19.1  %    20 - 24  HUM:      36.8  mm     G. Age:  22w 6d         51  %  CER:      25.1  mm     G. Age:  22w 6d         84  %  CM:          8  mm  Est. FW:     518   gm     1 lb 2 oz     44  % ---------------------------------------------------------------------- OB History  Gravidity:    1         Term:   0        Prem:   0        SAB:   0  TOP:          0       Ectopic:  0        Living: 0 ---------------------------------------------------------------------- Gestational Age  U/S Today:     22w 4d                                        EDD:   08/15/24  Best:          22w 4d     Det. By:  U/S (04/15/24)           EDD:   08/15/24 ---------------------------------------------------------------------- Anatomy  Cranium:               Appears normal         LVOT:                   Not well visualized  Cavum:                 Appears normal         Aortic Arch:            Not well visualized  Ventricles:            Appears normal         Ductal Arch:            Not well visualized  Choroid Plexus:        Appears normal         Diaphragm:              Appears normal  Cerebellum:            Appears normal         Stomach:                Appears normal, left                                                                        sided  Posterior Fossa:       Not well visualized    Abdomen:                Appears normal  Nuchal Fold:           Not applicable (< 16   Abdominal Wall:         Not well visualized                          wks GA)  Face:                  Not well visualized    Cord Vessels:           Appears normal (3                                                                        vessel cord)  Lips:                  Not well visualized    Kidneys:                Appear normal  Palate:                Not well visualized    Bladder:                Not well visualized  Thoracic:  Appears normal         Spine:                  Not well visualized  Heart:                 Appears normal         Upper Extremities:      Present                         (4CH, axis, and                         situs)  RVOT:                  Not well visualized    Lower Extremities:      Present  Other:  Technically difficult due to fetal position. ---------------------------------------------------------------------- Cervix Uterus Adnexa  Cervix  Length:              4  cm.  Normal appearance by transabdominal scan  Right Ovary  Not visualized.  Left Ovary  Size(cm)     2.77   x   1.57   x  1.68      Vol(ml): 3.83  Within normal limits.  Adnexa  No abnormality visualized ---------------------------------------------------------------------- Comments  This patient presented to the hospital due to abdominal pain.  She has not started prenatal care.  The fetal biometry measurements obtained today shows an  EFW 1 pound 2 ounces which is consistent with an EDC of  August 15, 2024, making her 22 weeks and 4 days  pregnant today.  The fetus was in the vertex presentation.  A normal-appearing anterior placenta is noted.   The patient should be referred to the MFM office for detailed  fetal anatomy scan once she initiates prenatal care. ----------------------------------------------------------------------                   Steffan Keys, MD Electronically Signed Final Report   04/15/2024 09:55 am ----------------------------------------------------------------------    Assessment and Plan  1. Intrauterine pregnancy (Primary) - Reviewed  results of U/S: IUP @ 22.[redacted] weeks gestation  [facility]: 08/15/2024  no gender seen on U/S d/t fetal position  2. Abdominal pain during pregnancy, antepartum - Resolved 2 weeks ago - Advised can take Tylenol  for pain - Information provided on safe meds in pregnancy list   3. [redacted] weeks gestation of pregnancy   - Discharge home - Needs to start [facility] [employer] with [facility]. [facility] to get [facility] appts scheduled - Patient verbalized an understanding of the plan of care and agrees.   Merwin Breden, CNM 04/14/2024, 11:51 PM

## 2024-04-14 NOTE — MAU Note (Signed)
 MAU Triage Note: Miranda Fernandez is a 28 y.o. at Unknown here in MAU reporting: sent via private vehicle from [facility] for abdominal pain and emesis. Her abdominal pain stopped around two weeks ago, but her nausea has continued. She is able to eat and drink limited foods. She reports 1 episode of vomiting today. Did not find out she was pregnant until being told at the hospital today. Denies VB or abnormal discharge.  Patient complaint: Med Cntr Trnsfr, abd pain  Pain Score: 0-No pain     Onset of complaint: on-going LMP: She believes her LMP was sometime in February  Vitals:   04/14/24 2119  BP: (!) 130/99  Pulse: 80  Resp: 20  Temp: 99.2 F (37.3 C)  SpO2: 100%    FHT:    RN able to audibly hear heartones, but unable to pick up long enough to get doppler reading Lab orders placed from triage: N/A

## 2024-04-14 NOTE — ED Triage Notes (Addendum)
 Reports intermittent R sided abdominal pain x2 weeks. Reports n/v. Has had difficulty keeping food down. States pain is at a 0/10 now. Also reports sinus congestion and cough x4 days. Denies sick contacts

## 2024-04-14 NOTE — ED Provider Notes (Signed)
 [place] [facility] Provider Note   CSN: [id number] Arrival date & time: 04/14/24  2105     Patient presents with: Abdominal Pain and Nasal Congestion   Miranda Fernandez is a 28 y.o. female.   The history is provided by the patient and medical records.  Abdominal Pain Miranda Fernandez is a 28 y.o. female who presents to the Emergency Department complaining of abdominal pain. She presents the emergency department for evaluation of 2 to 4 weeks of intermittent right sided abdominal pain that comes and goes with a full mass sensation to her right abdomen. She also reports 1 to 2 episodes of emesis daily for the last several months. No associated fever. She complains of pain in her bladder and feels like it is sometimes difficult to get the urine out. No vaginal discharge. She reports chronic constipation. She also reports cough for the last three days. No chest pain, leg swelling. She does have a history of PCOS and has irregular menstrual cycles. Her last cycle was in February. She is prediabetic. She does not take any medications. No prior pregnancies.     Prior to Admission medications   Medication Sig Start Date End Date Taking? Authorizing Provider  doxycycline  (VIBRAMYCIN ) 100 MG capsule Take 1 capsule (100 mg total) by mouth 2 (two) times daily. 09/13/23   Donnajean Lynwood DEL, PA-C  HYDROcodone -acetaminophen  (NORCO/VICODIN) 5-325 MG tablet Take 1-2 tablets by mouth every 6 (six) hours as needed for severe pain. 09/16/18   Angelena Smalls, MD  ibuprofen  (ADVIL ,MOTRIN ) 600 MG tablet Take 1 tablet (600 mg total) by mouth every 8 (eight) hours as needed for moderate pain. 09/16/18   Angelena Smalls, MD    Allergies: Other and Levonorgestrel-ethinyl estrad    Review of Systems  Gastrointestinal:  Positive for abdominal pain.  All other systems reviewed and are negative.   Updated Vital Signs BP (!) 130/99 (BP Location: Left Arm)   Pulse 80   Temp  99.2 F (37.3 C) (Oral)   Resp 20   Ht 5' 7 (1.702 m)   Wt 116 kg   LMP  (Within Months)   SpO2 100%   BMI 40.05 kg/m   Physical Exam Vitals and nursing note reviewed.  Constitutional:      Appearance: She is well-developed.  HENT:     Head: Normocephalic and atraumatic.  Cardiovascular:     Rate and Rhythm: Normal rate and regular rhythm.     Heart sounds: No murmur heard. Pulmonary:     Effort: Pulmonary effort is normal. No respiratory distress.     Breath sounds: Normal breath sounds.  Abdominal:     Palpations: Abdomen is soft.     Tenderness: There is no guarding or rebound.     Comments: Gravid uterus to the level of the umbilicus  Musculoskeletal:        General: No tenderness.  Skin:    General: Skin is warm and dry.  Neurological:     Mental Status: She is alert and oriented to person, place, and time.  Psychiatric:        Behavior: Behavior normal.     (all labs ordered are listed, but only abnormal results are displayed) Labs Reviewed  CBC - Abnormal; Notable for the following components:      Result Value   WBC 14.1 (*)    Hemoglobin 10.6 (*)    HCT 31.1 (*)    MCV 79.1 (*)  All other components within normal limits  URINALYSIS, ROUTINE W REFLEX MICROSCOPIC - Abnormal; Notable for the following components:   Hgb urine dipstick TRACE (*)    Nitrite POSITIVE (*)    Leukocytes,Ua SMALL (*)    All other components within normal limits  PREGNANCY, URINE - Abnormal; Notable for the following components:   Preg Test, Ur POSITIVE (*)    All other components within normal limits  URINALYSIS, MICROSCOPIC (REFLEX) - Abnormal; Notable for the following components:   Bacteria, UA MANY (*)    All other components within normal limits  RESP PANEL BY RT-PCR (RSV, FLU A&B, COVID)  RVPGX2  LIPASE, BLOOD  COMPREHENSIVE METABOLIC PANEL WITH GFR    EKG: None  Radiology: No results found.   Procedures   Medications Ordered in the ED - No data to  display                                  Medical Decision Making Amount and/or Complexity of Data Reviewed Labs: ordered.   Patient here for evaluation of right sided abdominal fullness and intermittent right sided abdominal pain, intermittent vomiting. She does have a palpable uterus on examination. No significant tenderness. No palpable contractions. Bedside ultrasound demonstrates a vigorous fetus. Formal ultrasound is not available at this facility. Given inability to identify dates with patients intermittent abdominal pain discussed with Dr. Erik with OB/GYN - plan to transfer to MAU for formal ultrasound to further evaluate pregnancy status. Patient did not have all labs returned at time of transfer. Feels she is stable for her ED to MAU transfer for further evaluation workup. Patient is presenting blood pressure was elevated at 133/99, recheck blood pressure prior to transfer in the 120s over 70s.     Final diagnoses:  Abdominal pain during pregnancy, antepartum    ED Discharge Orders     None          Griselda Norris, MD 04/15/24 0003

## 2024-04-14 NOTE — MAU Provider Note (Incomplete)
  History     CSN: [id number]  Arrival date and time: 04/14/24 2105   Event Date/Time   First Provider Initiated Contact with Patient 04/14/24 2350      Chief Complaint  Patient presents with  . Nausea   HPI Miranda Fernandez is a 28 y.o. year old G1P0 female at Unknown weeks gestation who presents to MAU reporting ***.   OB History     Gravida  1   Para      Term      Preterm      AB      Living         SAB      IAB      Ectopic      Multiple      Live Births              Past Medical History:  Diagnosis Date  . PCOS (polycystic ovarian syndrome)   . Seasonal allergies     History reviewed. No pertinent surgical history.  History reviewed. No pertinent family history.  Social History   Tobacco Use  . Smoking status: Never  . Smokeless tobacco: Never  Vaping Use  . Vaping status: Never Used  Substance Use Topics  . Alcohol use: No  . Drug use: Yes    Types: Marijuana    Allergies:  Allergies  Allergen Reactions  . Other     kiwi  . Levonorgestrel-Ethinyl Estrad Itching and Rash    Medications Prior to Admission  Medication Sig Dispense Refill Last Dose/Taking  . doxycycline  (VIBRAMYCIN ) 100 MG capsule Take 1 capsule (100 mg total) by mouth 2 (two) times daily. 14 capsule 0   . HYDROcodone -acetaminophen  (NORCO/VICODIN) 5-325 MG tablet Take 1-2 tablets by mouth every 6 (six) hours as needed for severe pain. 10 tablet 0   . ibuprofen  (ADVIL ,MOTRIN ) 600 MG tablet Take 1 tablet (600 mg total) by mouth every 8 (eight) hours as needed for moderate pain. 30 tablet 0     Review of Systems Physical Exam   Blood pressure 130/78, pulse 61, temperature 98.5 F (36.9 C), temperature source Oral, resp. rate 18, height 5' 7 (1.702 m), weight 105.8 kg, SpO2 100%.  Physical Exam  MAU Course  Procedures  MDM ***  Assessment and Plan  ***  Ala Cart, CNM 04/14/2024, 11:51 PM

## 2024-04-14 NOTE — ED Notes (Signed)
 EDP at bedside

## 2024-04-15 ENCOUNTER — Other Ambulatory Visit: Payer: Self-pay | Admitting: Obstetrics and Gynecology

## 2024-04-15 ENCOUNTER — Inpatient Hospital Stay (HOSPITAL_COMMUNITY)

## 2024-04-15 DIAGNOSIS — O0932 Supervision of pregnancy with insufficient antenatal care, second trimester: Secondary | ICD-10-CM

## 2024-04-15 DIAGNOSIS — Z3A37 37 weeks gestation of pregnancy: Secondary | ICD-10-CM | POA: Diagnosis not present

## 2024-04-15 DIAGNOSIS — O0930 Supervision of pregnancy with insufficient antenatal care, unspecified trimester: Secondary | ICD-10-CM | POA: Insufficient documentation

## 2024-04-15 DIAGNOSIS — O99210 Obesity complicating pregnancy, unspecified trimester: Secondary | ICD-10-CM | POA: Insufficient documentation

## 2024-04-15 MED ORDER — ONDANSETRON 8 MG PO TBDP
8.0000 mg | ORAL_TABLET | Freq: Three times a day (TID) | ORAL | 0 refills | Status: AC | PRN
Start: 2024-04-15 — End: ?

## 2024-04-15 MED ORDER — PREPLUS 27-1 MG PO TABS: 1.0000 | ORAL_TABLET | Freq: Every day | ORAL | 13 refills | Status: AC

## 2024-04-15 NOTE — Discharge Instructions (Signed)

## 2024-04-23 ENCOUNTER — Ambulatory Visit (INDEPENDENT_AMBULATORY_CARE_PROVIDER_SITE_OTHER): Admitting: Obstetrics

## 2024-04-23 ENCOUNTER — Encounter: Payer: Self-pay | Admitting: Obstetrics

## 2024-04-23 ENCOUNTER — Other Ambulatory Visit (HOSPITAL_COMMUNITY)
Admission: RE | Admit: 2024-04-23 | Discharge: 2024-04-23 | Disposition: A | Discharge status: Home / Self Care | Source: Ambulatory Visit | Attending: Obstetrics | Admitting: Obstetrics

## 2024-04-23 VITALS — BP 120/71 | HR 68 | Wt 229.6 lb

## 2024-04-23 DIAGNOSIS — O0992 Supervision of high risk pregnancy, unspecified, second trimester: Secondary | ICD-10-CM | POA: Insufficient documentation

## 2024-04-23 DIAGNOSIS — O99210 Obesity complicating pregnancy, unspecified trimester: Secondary | ICD-10-CM | POA: Diagnosis not present

## 2024-04-23 DIAGNOSIS — O0990 Supervision of high risk pregnancy, unspecified, unspecified trimester: Secondary | ICD-10-CM | POA: Diagnosis not present

## 2024-04-23 DIAGNOSIS — Z3401 Encounter for supervision of normal first pregnancy, first trimester: Secondary | ICD-10-CM | POA: Insufficient documentation

## 2024-04-23 DIAGNOSIS — O0932 Supervision of pregnancy with insufficient antenatal care, second trimester: Secondary | ICD-10-CM

## 2024-04-23 NOTE — Progress Notes (Unsigned)
 Subjective:    Miranda Fernandez is being seen today for her first obstetrical visit.  This is not a planned pregnancy. She is at Unknown gestation. Her obstetrical history is significant for N/A. Relationship with FOB: significant other, not living together. Patient does intend to breast feed. Pregnancy history fully reviewed.  The information documented in the HPI was reviewed and verified.  Menstrual History: OB History     Gravida  1   Para      Term      Preterm      AB      Living         SAB      IAB      Ectopic      Multiple      Live Births               No LMP recorded (within months). Patient is pregnant.    Past Medical History:  Diagnosis Date   PCOS (polycystic ovarian syndrome)    Seasonal allergies     No past surgical history on file.  (Not in a hospital admission)  Allergies  Allergen Reactions   Other     kiwi   Levonorgestrel-Ethinyl Estrad Itching and Rash    Social History   Tobacco Use   Smoking status: Never   Smokeless tobacco: Never  Substance Use Topics   Alcohol use: No    No family history on file.   Review of Systems Constitutional: negative for weight loss Gastrointestinal: negative for vomiting Genitourinary:negative for genital lesions and vaginal discharge and dysuria Musculoskeletal:negative for back pain Behavioral/Psych: negative for abusive relationship, depression, illegal drug usage and tobacco use    Objective:    BP 120/71   Pulse 68   Wt 229 lb 9.6 oz (104.1 kg)   LMP  (Within Months)   BMI 35.96 kg/m  General Appearance:    Alert, cooperative, no distress, appears stated age  Head:    Normocephalic, without obvious abnormality, atraumatic  Eyes:    PERRL, conjunctiva/corneas clear, EOM's intact, fundi    benign, both eyes  Ears:    Normal TM's and external ear canals, both ears  Nose:   Nares normal, septum midline, mucosa normal, no drainage    or sinus tenderness  Throat:   Lips, mucosa,  and tongue normal; teeth and gums normal  Neck:   Supple, symmetrical, trachea midline, no adenopathy;    thyroid:  no enlargement/tenderness/nodules; no carotid   bruit or JVD  Back:     Symmetric, no curvature, ROM normal, no CVA tenderness  Lungs:     Clear to auscultation bilaterally, respirations unlabored  Chest Wall:    No tenderness or deformity   Heart:    Regular rate and rhythm, S1 and S2 normal, no murmur, rub   or gallop  Breast Exam:    No tenderness, masses, or nipple abnormality  Abdomen:     Soft, non-tender, bowel sounds active all four quadrants,    no masses, no organomegaly  Genitalia:    Normal female without lesion, discharge or tenderness  Extremities:   Extremities normal, atraumatic, no cyanosis or edema  Pulses:   2+ and symmetric all extremities  Skin:   Skin color, texture, turgor normal, no rashes or lesions  Lymph nodes:   Cervical, supraclavicular, and axillary nodes normal  Neurologic:   CNII-XII intact, normal strength, sensation and reflexes    throughout      Lab Review  Urine pregnancy test Labs reviewed yes Radiologic studies reviewed yes   Assessment:    Pregnancy at Unknown weeks    Plan:    1. Encounter for supervision of normal first pregnancy in first trimester (Primary)  2. No prenatal care in current pregnancy in second trimester  3. Obesity in pregnancy, antepartum    Prenatal vitamins.  Counseling provided regarding continued use of seat belts, cessation of alcohol consumption, smoking or use of illicit drugs; infection precautions i.e., influenza/TDAP immunizations, toxoplasmosis,CMV, parvovirus, listeria and varicella; workplace safety, exercise during pregnancy; routine dental care, safe medications, sexual activity, hot tubs, saunas, pools, travel, caffeine use, fish and methlymercury, potential toxins, hair treatments, varicose veins Weight gain recommendations per IOM guidelines reviewed: underweight/BMI< 18.5--> gain 28 -  40 lbs; normal weight/BMI 18.5 - 24.9--> gain 25 - 35 lbs; overweight/BMI 25 - 29.9--> gain 15 - 25 lbs; obese/BMI >30->gain  11 - 20 lbs Problem list reviewed and updated. FIRST/CF mutation testing/NIPT/QUAD SCREEN/fragile X/Ashkenazi Jewish population testing/Spinal muscular atrophy discussed: requested. Role of ultrasound in pregnancy discussed; fetal survey: requested. Amniocentesis discussed: not indicated.  No orders of the defined types were placed in this encounter.  No orders of the defined types were placed in this encounter.   Follow up in 4 weeks.  I have spent a total of 20 minutes of face-to-face time, excluding clinical staff time, reviewing notes and preparing to see patient, ordering tests and/or medications, and counseling the patient.   CARLIN RONAL CENTERS, MD, FACOG Attending Obstetrician & Gynecologist, [facility], [facility], [place] 04/23/2024

## 2024-04-23 NOTE — Progress Notes (Unsigned)
 Subjective:    Miranda Fernandez is being seen today for her first obstetrical visit.  This {is/is not:9024} a planned pregnancy. She is at Unknown gestation. Her obstetrical history is significant for {ob risk factors:10154}. Relationship with FOB: {fob:16621}. Patient {does/does not:19097} intend to breast feed. Pregnancy history fully reviewed.  The information documented in the HPI was reviewed and verified.  Menstrual History: OB History     Gravida  1   Para      Term      Preterm      AB      Living         SAB      IAB      Ectopic      Multiple      Live Births              Menarche age: *** No LMP recorded (within months). Patient is pregnant.    Past Medical History:  Diagnosis Date   PCOS (polycystic ovarian syndrome)    Seasonal allergies     No past surgical history on file.  (Not in a hospital admission)  Allergies  Allergen Reactions   Other     kiwi   Levonorgestrel-Ethinyl Estrad Itching and Rash    Social History   Tobacco Use   Smoking status: Never   Smokeless tobacco: Never  Substance Use Topics   Alcohol use: No    No family history on file.   Review of Systems Constitutional: negative for weight loss Gastrointestinal: negative for vomiting Genitourinary:negative for genital lesions and vaginal discharge and dysuria Musculoskeletal:negative for back pain Behavioral/Psych: negative for abusive relationship, depression, illegal drug usage and tobacco use    Objective:    BP 120/71   Pulse 68   Wt 229 lb 9.6 oz (104.1 kg)   LMP  (Within Months)   BMI 35.96 kg/m  General Appearance:    Alert, cooperative, no distress, appears stated age  Head:    Normocephalic, without obvious abnormality, atraumatic  Eyes:    PERRL, conjunctiva/corneas clear, EOM's intact, fundi    benign, both eyes  Ears:    Normal TM's and external ear canals, both ears  Nose:   Nares normal, septum midline, mucosa normal, no drainage    or sinus  tenderness  Throat:   Lips, mucosa, and tongue normal; teeth and gums normal  Neck:   Supple, symmetrical, trachea midline, no adenopathy;    thyroid:  no enlargement/tenderness/nodules; no carotid   bruit or JVD  Back:     Symmetric, no curvature, ROM normal, no CVA tenderness  Lungs:     Clear to auscultation bilaterally, respirations unlabored  Chest Wall:    No tenderness or deformity   Heart:    Regular rate and rhythm, S1 and S2 normal, no murmur, rub   or gallop  Breast Exam:    No tenderness, masses, or nipple abnormality  Abdomen:     Soft, non-tender, bowel sounds active all four quadrants,    no masses, no organomegaly  Genitalia:    Normal female without lesion, discharge or tenderness  Extremities:   Extremities normal, atraumatic, no cyanosis or edema  Pulses:   2+ and symmetric all extremities  Skin:   Skin color, texture, turgor normal, no rashes or lesions  Lymph nodes:   Cervical, supraclavicular, and axillary nodes normal  Neurologic:   CNII-XII intact, normal strength, sensation and reflexes    throughout      Lab  Review Urine pregnancy test Labs reviewed {YES NO:22349} Radiologic studies reviewed {YES NO:22349}  Assessment:    Pregnancy at Unknown weeks    Plan:      Prenatal vitamins.  Counseling provided regarding continued use of seat belts, cessation of alcohol consumption, smoking or use of illicit drugs; infection precautions i.e., influenza/TDAP immunizations, toxoplasmosis,CMV, parvovirus, listeria and varicella; workplace safety, exercise during pregnancy; routine dental care, safe medications, sexual activity, hot tubs, saunas, pools, travel, caffeine use, fish and methlymercury, potential toxins, hair treatments, varicose veins Weight gain recommendations per IOM guidelines reviewed: underweight/BMI< 18.5--> gain 28 - 40 lbs; normal weight/BMI 18.5 - 24.9--> gain 25 - 35 lbs; overweight/BMI 25 - 29.9--> gain 15 - 25 lbs; obese/BMI >30->gain  11 - 20  lbs Problem list reviewed and updated. FIRST/CF mutation testing/NIPT/QUAD SCREEN/fragile X/Ashkenazi Jewish population testing/Spinal muscular atrophy discussed: {requests/ordered/declines:14581}. Role of ultrasound in pregnancy discussed; fetal survey: {requests/ordered/declines:14581}. Amniocentesis discussed: {amniocentesis:14582}. VBAC calculator score: VBAC consent form provided No orders of the defined types were placed in this encounter.  Orders Placed This Encounter  Procedures   Culture, OB Urine   CBC/D/Plt+RPR+Rh+ABO+RubIgG...    Release to patient:   Immediate   HgB A1c   PANORAMA PRENATAL TEST    ==========Department Information========== ID: [id number] Department:[facility] HEALTHCARE AT [facility] [address], [address] [place] [address] Dept: [phone] Dept Fax: [phone]     Method/type of collection::   Clinic to manage sample collection    Expected due date (MM/DD/YYYY)::   08/15/2024    Is this a twin pregnancy? (viable, no vanished twin):   Not Twin Pregnancy, Gerri    Is this a surrogate or egg donor pregnancy?:   No    I want fetal sex included in the report::   Yes    Maternal Weight (lbs)::   229    Which Microdeletion Panel should be ordered?:   22q11.2 Deletion    What type of billing?:   Automatic Data    By placing this electronic order I confirm the testing ordered herein is medically necessary and this patient has been informed of the details of the genetic test(s) ordered, including the risks, benefits, and alternatives, and has consented to testing.:   Yes    Select an order diagnosis: For additional options refer to [email or web address].[email or web address]:   Encounter for supervision of normal first pregnancy in first trimester [669301]   HORIZON Basic Panel    ==========Department Information========== ID: [id number] Department:[facility]ALTHCARE AT [facility] [address], [address] [place] [address] Dept: [phone] Dept Fax: [phone]     Specify the name or ID of a valid Horizon Custom Panel::   HBASIC    Is patient pregnant?:   Yes    Practice ensures that HIPAA consent is obtained and will make available to [employer] upon request?:   Yes    By placing this electronic order I confirm the testing ordered herein is medically necessary and this patient has been informed of the details of the genetic test(s) ordered, including the risks, benefits, and alternatives, and has consented to testing.:   Yes    What type of billing?:   [employer] an order diagnosis: For additional options refer to [email or web address].[email or web address]:  Encounter for supervision of other normal pregnancy, first trimester [8370109]    Tay-Sachs add-on test?:   No    Follow up in {numbers 0-4:31231} weeks.  ***% of *** min visit spent on counseling and coordination of care.   CARLIN RONAL CENTERS, MD, FACOG Attending Obstetrician & Gynecologist, [facility] for [facility], [facility], [place] 04/23/2024

## 2024-04-24 LAB — CBC/D/PLT+RPR+RH+ABO+RUBIGG...
Antibody Screen: NEGATIVE
Basophils Absolute: 0 x10E3/uL (ref 0.0–0.2)
Basos: 0 %
EOS (ABSOLUTE): 0.2 x10E3/uL (ref 0.0–0.4)
Eos: 2 %
HCV Ab: NONREACTIVE
HIV Screen 4th Generation wRfx: NONREACTIVE
Hematocrit: 33.5 % — ABNORMAL LOW (ref 34.0–46.6)
Hemoglobin: 11 g/dL — ABNORMAL LOW (ref 11.1–15.9)
Hepatitis B Surface Ag: NEGATIVE
Immature Grans (Abs): 0 x10E3/uL (ref 0.0–0.1)
Immature Granulocytes: 0 %
Lymphocytes Absolute: 2.1 x10E3/uL (ref 0.7–3.1)
Lymphs: 17 %
MCH: 27.6 pg (ref 26.6–33.0)
MCHC: 32.8 g/dL (ref 31.5–35.7)
MCV: 84 fL (ref 79–97)
Monocytes Absolute: 0.6 x10E3/uL (ref 0.1–0.9)
Monocytes: 5 %
Neutrophils Absolute: 9.4 x10E3/uL — ABNORMAL HIGH (ref 1.4–7.0)
Neutrophils: 76 %
Platelets: 370 x10E3/uL (ref 150–450)
RBC: 3.98 x10E6/uL (ref 3.77–5.28)
RDW: 13.2 % (ref 11.7–15.4)
RPR Ser Ql: NONREACTIVE
Rh Factor: POSITIVE
Rubella Antibodies, IGG: 4.53 {index} (ref >0.99)
WBC: 12.3 x10E3/uL — ABNORMAL HIGH (ref 3.4–10.8)

## 2024-04-24 LAB — CYTOLOGY - PAP: Diagnosis: NEGATIVE

## 2024-04-24 LAB — CERVICOVAGINAL ANCILLARY ONLY
Bacterial Vaginitis (gardnerella): NEGATIVE
Candida Glabrata: NEGATIVE
Candida Vaginitis: NEGATIVE
Chlamydia: NEGATIVE
Comment: NEGATIVE
Comment: NEGATIVE
Comment: NEGATIVE
Comment: NEGATIVE
Comment: NEGATIVE
Comment: NORMAL
Neisseria Gonorrhea: NEGATIVE
Trichomonas: NEGATIVE

## 2024-04-24 LAB — HEMOGLOBIN A1C
Est. average glucose Bld gHb Est-mCnc: 105 mg/dL
Hgb A1c MFr Bld: 5.3 % (ref 4.8–5.6)

## 2024-04-24 LAB — HCV INTERPRETATION

## 2024-05-11 ENCOUNTER — Ambulatory Visit: Attending: Maternal & Fetal Medicine | Admitting: Maternal & Fetal Medicine

## 2024-05-11 ENCOUNTER — Ambulatory Visit

## 2024-05-11 VITALS — BP 117/62 | HR 82

## 2024-05-11 DIAGNOSIS — E669 Obesity, unspecified: Secondary | ICD-10-CM

## 2024-05-11 DIAGNOSIS — Z3A26 26 weeks gestation of pregnancy: Secondary | ICD-10-CM | POA: Insufficient documentation

## 2024-05-11 DIAGNOSIS — O99210 Obesity complicating pregnancy, unspecified trimester: Secondary | ICD-10-CM

## 2024-05-11 DIAGNOSIS — O0932 Supervision of pregnancy with insufficient antenatal care, second trimester: Secondary | ICD-10-CM | POA: Insufficient documentation

## 2024-05-11 DIAGNOSIS — O99212 Obesity complicating pregnancy, second trimester: Secondary | ICD-10-CM | POA: Diagnosis not present

## 2024-05-11 DIAGNOSIS — Z363 Encounter for antenatal screening for malformations: Secondary | ICD-10-CM | POA: Diagnosis not present

## 2024-05-11 NOTE — Progress Notes (Signed)
 Patient information  Patient Name: Miranda Fernandez  Patient MRN:   [id number]  Referring practice: MFM Referring Provider: [facility]  Problem List   Patient Active Problem List   Diagnosis Date Noted   Encounter for supervision of normal first pregnancy in first trimester 04/23/2024   Obesity in pregnancy, antepartum 04/15/2024   No prenatal care in current pregnancy in second trimester 04/15/2024   Maternal Fetal Medicine Consult Miranda Fernandez is a 28 y.o. G1P0 at [redacted]w[redacted]d here for ultrasound and consultation.  She has currently declined all forms of aneuploidy screening. She has no acute concerns.   Today we focused on the following:   Elevated BMI: I discussed the potential complications associated with obesity in pregnancy.  These complications include but are not limited to increased risk of excessive maternal weight gain, fetal growth abnormalities, fetal congenital disorders, inability to visualize fetal anatomic structures on ultrasound, gestational diabetes, hypertensive disorders of pregnancy, operative birth including cesarean delivery or assisted vaginal delivery, delayed wound healing and many long-term health complications.  I discussed the need for continued growth ultrasounds and possibly antenatal testing depending upon how the pregnancy course progresses.  Maternal weight gain should be limited to 10 to 20 pounds during the pregnancy.  While normal weight loss may occur during the first and early second trimester, efforts to actively lose weight with the use of medication is not recommended during pregnancy.  A whole food diet and regular exercise of at least 15 to 30 minutes of moderately strenuous activity is recommended in the absence of any contraindications. Weight loss with the use of medications is not recommended during pregnancy.  If other existing comorbidities are present then 81 mg of aspirin should be considered for preeclampsia risk reduction.    recommendations.   Recommendations -After review of the patients LMP, menstrual cycle history and any prior US , the EDD should be Estimated Date of Delivery: 08/15/24.  -Aneuploidy screening declined.  -Anatomy ultrasound was done today with the above findings (see report). -Aspirin 81-162 mg continued throughout the pregnancy for preeclampsia prophylaxis. -Baseline labs: CMP, CBC, urine protein creatinine ratio if not previously completed. Repeat with any concerns for preeclampsia. -Blood pressure goal of < 140 systolic and < 90 diastolic. Antihypertensive medication should be added/adjusted until BP goal is achieved. -Serial growth ultrasounds every 4 weeks starting at 28 weeks until delivery. -Delivery likely around EDD or sooner if indicated.  Review of Systems: A review of systems was performed and was negative except per HPI   Past Obstetrical History:  OB History  Gravida Para Term Preterm AB Living  1       SAB IAB Ectopic Multiple Live Births          # Outcome Date GA Lbr Len/2nd Weight Sex Type Anes PTL Lv  1 Current              Past Medical History:  Past Medical History:  Diagnosis Date   PCOS (polycystic ovarian syndrome)    Pre-diabetes    Seasonal allergies      Past Surgical History:    Past Surgical History:  Procedure Laterality Date   NO PAST SURGERIES       Home Medications:   Current Outpatient Medications on File Prior to Visit  Medication Sig Dispense Refill   ondansetron (ZOFRAN-ODT) 8 MG disintegrating tablet Take 1 tablet (8 mg total) by mouth every 8 (eight) hours as needed for nausea or vomiting. 20 tablet 0  Prenatal Vit-Fe Fumarate-FA (PREPLUS) 27-1 MG TABS Take 1 tablet by mouth daily. 30 tablet 13   No current facility-administered medications on file prior to visit.      Allergies:   Allergies  Allergen Reactions   Other     kiwi   Levonorgestrel-Ethinyl Estrad Itching and Rash     Physical Exam:   Vitals:   05/11/24 0808   BP: 117/62  Pulse: 82   Sitting comfortably on the sonogram table Nonlabored breathing Normal rate and rhythm Abdomen is nontender  Thank you for the opportunity to be involved with this patient's care. Please let us  know if we can be of any further assistance.   30 minutes of time was spent reviewing the patient's chart including labs, imaging and documentation.  At least 50% of this time was spent with direct patient care discussing the diagnosis, management and prognosis of her care.  Delora Smaller MFM, [facility]   05/11/2024  9:58 AM

## 2024-05-21 ENCOUNTER — Ambulatory Visit (INDEPENDENT_AMBULATORY_CARE_PROVIDER_SITE_OTHER): Admitting: Obstetrics and Gynecology

## 2024-05-21 ENCOUNTER — Encounter: Payer: Self-pay | Admitting: Obstetrics and Gynecology

## 2024-05-21 VITALS — BP 128/75 | HR 79 | Wt 240.0 lb

## 2024-05-21 DIAGNOSIS — O0992 Supervision of high risk pregnancy, unspecified, second trimester: Secondary | ICD-10-CM

## 2024-05-21 DIAGNOSIS — O0932 Supervision of pregnancy with insufficient antenatal care, second trimester: Secondary | ICD-10-CM | POA: Diagnosis not present

## 2024-05-21 DIAGNOSIS — O99210 Obesity complicating pregnancy, unspecified trimester: Secondary | ICD-10-CM | POA: Diagnosis not present

## 2024-05-21 MED ORDER — PROMETHAZINE HCL 25 MG PO TABS: 25.0000 mg | ORAL_TABLET | Freq: Four times a day (QID) | ORAL | 2 refills | Status: AC | PRN

## 2024-05-21 MED ORDER — VITAFOL ULTRA 29-0.6-0.4-200 MG PO CAPS: 1.0000 | ORAL_CAPSULE | Freq: Every day | ORAL | 11 refills | Status: AC

## 2024-05-21 MED ORDER — ASPIRIN 81 MG PO TBEC
81.0000 mg | DELAYED_RELEASE_TABLET | Freq: Every day | ORAL | 2 refills | Status: AC
Start: 2024-05-21 — End: ?

## 2024-05-21 NOTE — Progress Notes (Signed)
   PRENATAL VISIT NOTE  Subjective:  Miranda Fernandez is a 28 y.o. G1P0 at [redacted]w[redacted]d being seen today for ongoing prenatal care.  She is currently monitored for the following issues for this low-risk pregnancy and has Obesity in pregnancy, antepartum; No prenatal care in current pregnancy in second trimester; and Supervision of high risk pregnancy, antepartum, second trimester on their problem list.  Patient reports nausea.  Contractions: Irritability. Vag. Bleeding: None.  Movement: Present. Denies leaking of fluid.   The following portions of the patient's history were reviewed and updated as appropriate: allergies, current medications, past family history, past medical history, past social history, past surgical history and problem list.   Objective:   Vitals:   05/21/24 1404  BP: 128/75  Pulse: 79  Weight: 240 lb (108.9 kg)    Fetal Status:  Fetal Heart Rate (bpm): 150 Fundal Height: 28 cm Movement: Present    General: Alert, oriented and cooperative. Patient is in no acute distress.  Skin: Skin is warm and dry. No rash noted.   Cardiovascular: Normal heart rate noted  Respiratory: Normal respiratory effort, no problems with respiration noted  Abdomen: Soft, gravid, appropriate for gestational age.  Pain/Pressure: Present     Pelvic: Cervical exam deferred        Extremities: Normal range of motion.     Mental Status: Normal mood and affect. Normal behavior. Normal judgment and thought content.       No data to display               No data to display          Assessment and Plan:  Pregnancy: G1P0 at [redacted]w[redacted]d 1. Supervision of high risk pregnancy, antepartum, second trimester (Primary) Patient is doing well with persistent nausea Rx phenergan  provided Patient is interested in waterbirth- information provided Third trimester labs and glucola next visit Patient desires to transfer care to [facility] office  2. Obesity in pregnancy, antepartum Rx ASA provided Patient declined  previously  3. No prenatal care in current pregnancy in second trimester   Preterm labor symptoms and general obstetric precautions including but not limited to vaginal bleeding, contractions, leaking of fluid and fetal movement were reviewed in detail with the patient. Please refer to After Visit Summary for other counseling recommendations.   Return in about 2 weeks (around 06/04/2024) for in person, ROB, Low risk, 2 hr glucola next visit.  Future Appointments  Date Time Provider Department Center  06/04/2024  8:15 AM [facility] None  06/04/2024  9:35 AM Erik Kieth BROCKS, MD [facility] None  06/22/2024  2:15 PM [facility] PROVIDER 1 [facility]  06/22/2024  2:30 PM [facility] US2 [facility]    Winton Felt, MD

## 2024-05-21 NOTE — Patient Instructions (Signed)
   Considering Waterbirth? Guide for patients at [facility]) Why consider waterbirth? Gentle birth for babies  Less pain medicine used in labor  May allow for passive descent/less pushing  May reduce perineal tears  More mobility and instinctive maternal position changes  Increased maternal relaxation   Is waterbirth safe? What are the risks of infection, drowning or other complications? Infection:  Very low risk (3.7 % for tub vs 4.8% for bed)  7 in 8000 waterbirths with documented infection  Poorly cleaned equipment most common cause  Slightly lower group B strep transmission rate  Drowning  Maternal:  Very low risk  Related to seizures or fainting  Newborn:  Very low risk. No evidence of increased risk of respiratory problems in multiple large studies  Physiological protection from breathing under water  Avoid underwater birth if there are any fetal complications  Once baby's head is out of the water, keep it out.  Birth complication  Some reports of cord trauma, but risk decreased by bringing baby to surface gradually  No evidence of increased risk of shoulder dystocia. Mothers can usually change positions faster in water than in a bed, possibly aiding the maneuvers to free the shoulder.   There are 2 things you MUST do to have a waterbirth with [facility]: Attend a waterbirth class at [facility] at [facility]   3rd Wednesday of every month from 7-9 pm (virtual during COVID) [employer] at [email or web address] or [email or web address] or by calling [phone] Bring us  the certificate from the class to your prenatal appointment or send via MyChart Meet with a midwife at 36 weeks* to see if you can still plan a waterbirth and to sign the consent.   *We also recommend that you schedule as many of your prenatal visits with a midwife as possible.    Helpful information: You may want to bring a bathing suit top to the hospital  to wear during labor but this is optional.  All other supplies are provided by the hospital. Please arrive at the hospital with signs of active labor, and do not wait at home until late in labor. It takes 45 min- 1 hour for fetal monitoring, and check in to your room to take place, plus transport and filling of the waterbirth tub.    Things that would prevent you from having a waterbirth: Premature, <37wks  Previous cesarean birth  Presence of thick meconium-stained fluid  Multiple gestation (Twins, triplets, etc.)  Uncontrolled diabetes or gestational diabetes requiring medication  Hypertension diagnosed in pregnancy or preexisting hypertension (gestational hypertension, preeclampsia, or chronic hypertension) Fetal growth restriction (your baby measures less than 10th percentile on ultrasound) Heavy vaginal bleeding  Non-reassuring fetal heart rate  Active infection (MRSA, etc.). Group B Strep is NOT a contraindication for waterbirth.  If your labor has to be induced and induction method requires continuous monitoring of the baby's heart rate  Other risks/issues identified by your obstetrical provider   Please remember that birth is unpredictable. Under certain unforeseeable circumstances your provider may advise against giving birth in the tub. These decisions will be made on a case-by-case basis and with the safety of you and your baby as our highest priority.    Updated 09/30/21

## 2024-05-21 NOTE — Progress Notes (Signed)
 Pt states she is still having some nausea, would like new Rx.  Pt may also wanting to change providers to someone one in [facility].

## 2024-06-04 ENCOUNTER — Ambulatory Visit: Payer: Self-pay | Admitting: Obstetrics and Gynecology

## 2024-06-04 ENCOUNTER — Other Ambulatory Visit

## 2024-06-04 VITALS — BP 125/77 | HR 94 | Wt 248.0 lb

## 2024-06-04 DIAGNOSIS — O0933 Supervision of pregnancy with insufficient antenatal care, third trimester: Secondary | ICD-10-CM

## 2024-06-04 DIAGNOSIS — O0993 Supervision of high risk pregnancy, unspecified, third trimester: Secondary | ICD-10-CM

## 2024-06-04 DIAGNOSIS — Z3A29 29 weeks gestation of pregnancy: Secondary | ICD-10-CM

## 2024-06-04 DIAGNOSIS — O99213 Obesity complicating pregnancy, third trimester: Secondary | ICD-10-CM

## 2024-06-04 DIAGNOSIS — O0930 Supervision of pregnancy with insufficient antenatal care, unspecified trimester: Secondary | ICD-10-CM

## 2024-06-04 DIAGNOSIS — O99210 Obesity complicating pregnancy, unspecified trimester: Secondary | ICD-10-CM

## 2024-06-04 DIAGNOSIS — O0992 Supervision of high risk pregnancy, unspecified, second trimester: Secondary | ICD-10-CM

## 2024-06-04 NOTE — Progress Notes (Signed)
   PRENATAL VISIT NOTE  Subjective:  Miranda Fernandez is a 28 y.o. G1P0 at [redacted]w[redacted]d being seen today for ongoing prenatal care.  She is currently monitored for the following issues for this low-risk pregnancy and has Obesity in pregnancy, antepartum; No prenatal care in current pregnancy in second trimester; and Supervision of high risk pregnancy, antepartum, second trimester on their problem list.  Patient reports no complaints. Looking at a job that involves heavy lifting (up to 70lbs). She lifted this much prior to pregnancy, so feel it is reasonable to pursue this job.  Contractions: Not present. Vag. Bleeding: None.  Movement: Present. Denies leaking of fluid.   The following portions of the patient's history were reviewed and updated as appropriate: allergies, current medications, past family history, past medical history, past social history, past surgical history and problem list.   Objective:   Vitals:   06/04/24 0837  BP: 125/77  Pulse: 94  Weight: 248 lb (112.5 kg)    Fetal Status: Fetal Heart Rate (bpm): 145   Movement: Present     General:  Alert, oriented and cooperative. Patient is in no acute distress.  Skin: Skin is warm and dry. No rash noted.   Cardiovascular: Normal heart rate noted  Respiratory: Normal respiratory effort, no problems with respiration noted  Abdomen: Soft, gravid, appropriate for gestational age.  Pain/Pressure: Present      Assessment and Plan:  Pregnancy: G1P0 at [redacted]w[redacted]d 1. Supervision of high risk pregnancy, antepartum, second trimester (Primary) 2. [redacted] weeks gestation of pregnancy Normal anatomy/AGA on 11/14 Declines flu; considering tdap - Glucose Tolerance, 2 Hours w/1 Hour - RPR - CBC - HIV antibody (with reflex) - Ambulatory Referral to [facility]  3. Late prenatal care - Ambulatory Referral to [facility]  4. Obesity in pregnancy, antepartum ldASA  Please refer to After Visit Summary for other counseling recommendations.    Return in about 2 weeks (around 06/18/2024) for return OB at 31-32 weeks.  Future Appointments  Date Time Provider Department Center  06/04/2024  9:35 AM Erik Kieth BROCKS, MD [facility] None  06/22/2024  2:15 PM [facility] PROVIDER 1 [facility]  06/22/2024  2:30 PM [facility] US2 [facility]    Kieth BROCKS Erik, MD

## 2024-06-04 NOTE — Progress Notes (Signed)
 ROB/GTT.  Pt declined FLU, RSV and TDAP vaccines.  GAD-7=7 Pt refused treatment.

## 2024-06-05 ENCOUNTER — Ambulatory Visit: Payer: Self-pay | Admitting: Obstetrics and Gynecology

## 2024-06-05 DIAGNOSIS — D649 Anemia, unspecified: Secondary | ICD-10-CM | POA: Insufficient documentation

## 2024-06-05 DIAGNOSIS — O0992 Supervision of high risk pregnancy, unspecified, second trimester: Secondary | ICD-10-CM

## 2024-06-05 LAB — CBC
Hematocrit: 32.7 % — ABNORMAL LOW (ref 34.0–46.6)
Hemoglobin: 10.5 g/dL — ABNORMAL LOW (ref 11.1–15.9)
MCH: 26.9 pg (ref 26.6–33.0)
MCHC: 32.1 g/dL (ref 31.5–35.7)
MCV: 84 fL (ref 79–97)
Platelets: 357 x10E3/uL (ref 150–450)
RBC: 3.9 x10E6/uL (ref 3.77–5.28)
RDW: 13.4 % (ref 11.7–15.4)
WBC: 20 x10E3/uL (ref 3.4–10.8)

## 2024-06-05 LAB — GLUCOSE TOLERANCE, 2 HOURS W/ 1HR
Glucose, 1 hour: 150 mg/dL (ref 70–179)
Glucose, 2 hour: 111 mg/dL (ref 70–152)
Glucose, Fasting: 91 mg/dL (ref 70–91)

## 2024-06-05 LAB — HIV ANTIBODY (ROUTINE TESTING W REFLEX): HIV Screen 4th Generation wRfx: NONREACTIVE

## 2024-06-05 LAB — SYPHILIS: RPR W/REFLEX TO RPR TITER AND TREPONEMAL ANTIBODIES, TRADITIONAL SCREENING AND DIAGNOSIS ALGORITHM: RPR Ser Ql: NONREACTIVE

## 2024-06-05 MED ORDER — FERROUS SULFATE 325 (65 FE) MG PO TABS: 325.0000 mg | ORAL_TABLET | ORAL | 1 refills | Status: AC

## 2024-06-18 ENCOUNTER — Encounter: Admitting: Obstetrics and Gynecology

## 2024-06-22 ENCOUNTER — Ambulatory Visit: Attending: Maternal & Fetal Medicine

## 2024-06-22 ENCOUNTER — Ambulatory Visit: Admitting: Maternal & Fetal Medicine

## 2024-06-22 VITALS — BP 118/56 | HR 78

## 2024-06-22 DIAGNOSIS — Z3A32 32 weeks gestation of pregnancy: Secondary | ICD-10-CM | POA: Insufficient documentation

## 2024-06-22 DIAGNOSIS — O99213 Obesity complicating pregnancy, third trimester: Secondary | ICD-10-CM | POA: Diagnosis not present

## 2024-06-22 DIAGNOSIS — O99210 Obesity complicating pregnancy, unspecified trimester: Secondary | ICD-10-CM | POA: Diagnosis present

## 2024-06-22 DIAGNOSIS — E669 Obesity, unspecified: Secondary | ICD-10-CM

## 2024-06-22 DIAGNOSIS — O0932 Supervision of pregnancy with insufficient antenatal care, second trimester: Secondary | ICD-10-CM | POA: Insufficient documentation

## 2024-06-22 DIAGNOSIS — O403XX0 Polyhydramnios, third trimester, not applicable or unspecified: Secondary | ICD-10-CM

## 2024-06-22 DIAGNOSIS — O0933 Supervision of pregnancy with insufficient antenatal care, third trimester: Secondary | ICD-10-CM | POA: Diagnosis not present

## 2024-06-22 NOTE — Progress Notes (Unsigned)
 na

## 2024-06-22 NOTE — Progress Notes (Signed)
" ° °  Patient information  Patient Name: Miranda Fernandez  Patient MRN:   [id number]  Referring practice: MFM Referring Provider: [place] [facility]  Problem List   Patient Active Problem List   Diagnosis Date Noted   Anemia 06/05/2024   Supervision of high risk pregnancy, antepartum, second trimester 04/23/2024   Obesity in pregnancy, antepartum 04/15/2024   Late prenatal care 04/15/2024   Maternal Fetal medicine Consult  Miranda Fernandez is a 28 y.o. G1P0 at [redacted]w[redacted]d here for ultrasound and consultation. Miranda Fernandez is doing well today with no acute concerns. Today we focused on the following:   The patient presents for a follow-up growth ultrasound. She had her initial ultrasound at approximately [redacted] weeks gestation and returns today to complete the anatomic evaluation and estimate fetal weight. The pregnancy is also complicated by an elevated pre-pregnancy BMI of 35. She reports a maternal family history of sickle cell trait but missed her prior appointment for Horizon carrier screening; this can be completed at a future visit with her OB provider. Todays ultrasound demonstrates mild polyhydramnios with an amniotic fluid index of 26 cm. We discussed that this is most likely a normal variant but may also be associated with glucose intolerance despite a previously normal glucose tolerance test, or less likely a fetal or placental abnormality given the mild degree. Potential complications were reviewed, including preterm contractions, fetal malpresentation, and cord presentation. She was encouraged to moderate carbohydrate and sugar intake, increase physical activity, and return in four weeks for repeat growth ultrasound to reassess fetal weight, amniotic fluid, and confirm cephalic presentation.  RECOMMENDATIONS -Return in four weeks for a growth ultrasound with assessment of estimated fetal weight and amniotic fluid index -Confirm fetal presentation at follow-up ultrasound -Moderate carbohydrate  and sugar intake -Increase physical activity as tolerated -Complete Horizon carrier screening at a future OB visit -Review precautions for preterm contractions, malpresentation, and cord presentation -Continue routine obstetric care with primary OB provider  The patient had time to ask questions that were answered to her satisfaction.  She verbalized understanding and agrees to proceed with the plan above.   I spent 30 minutes reviewing the patients chart, including labs and images as well as counseling the patient about her medical conditions. Greater than 50% of the time was spent in direct face-to-face patient counseling.  Delora Smaller  MFM, [place]   06/22/2024  4:27 PM   Review of Systems: A review of systems was performed and was negative except per HPI   Vitals and Physical Exam    06/22/2024    2:47 PM 06/04/2024    8:37 AM 05/21/2024    2:04 PM  Vitals with BMI  Weight  248 lbs 240 lbs  Systolic 118 125 871  Diastolic 56 77 75  Pulse 78 94 79    Sitting comfortably on the sonogram table Nonlabored breathing Normal rate and rhythm Abdomen is nontender  Past pregnancies OB History  Gravida Para Term Preterm AB Living  1       SAB IAB Ectopic Multiple Live Births          # Outcome Date GA Lbr Len/2nd Weight Sex Type Anes PTL Lv  1 Current              Future Appointments  Date Time Provider Department Center  07/19/2024  1:15 PM [facility] PROVIDER 1 [facility]  07/19/2024  1:30 PM [facility] US4 [facility]      "

## 2024-07-04 ENCOUNTER — Ambulatory Visit: Admitting: Student

## 2024-07-04 VITALS — BP 126/75 | HR 90 | Wt 249.3 lb

## 2024-07-04 DIAGNOSIS — O0933 Supervision of pregnancy with insufficient antenatal care, third trimester: Secondary | ICD-10-CM | POA: Diagnosis not present

## 2024-07-04 DIAGNOSIS — O0930 Supervision of pregnancy with insufficient antenatal care, unspecified trimester: Secondary | ICD-10-CM

## 2024-07-04 DIAGNOSIS — O99213 Obesity complicating pregnancy, third trimester: Secondary | ICD-10-CM

## 2024-07-04 DIAGNOSIS — O99210 Obesity complicating pregnancy, unspecified trimester: Secondary | ICD-10-CM

## 2024-07-04 DIAGNOSIS — O0992 Supervision of high risk pregnancy, unspecified, second trimester: Secondary | ICD-10-CM

## 2024-07-04 DIAGNOSIS — O0993 Supervision of high risk pregnancy, unspecified, third trimester: Secondary | ICD-10-CM

## 2024-07-04 DIAGNOSIS — Z3A34 34 weeks gestation of pregnancy: Secondary | ICD-10-CM | POA: Diagnosis not present

## 2024-07-04 NOTE — Progress Notes (Signed)
 Pt presents for rob. Pt states that she has the sickle cell trait. Pt states that she lost her mucus plug. Pt wants to know if she will take the 81 mg aspirin  after giving birth. No other questions or concerns at this time.

## 2024-07-06 NOTE — Progress Notes (Signed)
" ° °  PRENATAL VISIT NOTE  Subjective:  Miranda Fernandez is a 29 y.o. G1P0 at [redacted]w[redacted]d being seen today for ongoing prenatal care.  She is currently monitored for the following issues for this high-risk pregnancy and has Obesity in pregnancy, antepartum; Late prenatal care; Supervision of high risk pregnancy, antepartum, second trimester; and Anemia on their problem list.  Patient reports no complaints.  Contractions: Not present. Vag. Bleeding: None.  Movement: Present. Denies leaking of fluid.   The following portions of the patient's history were reviewed and updated as appropriate: allergies, current medications, past family history, past medical history, past social history, past surgical history and problem list.   Objective:   Vitals:   07/04/24 1521  BP: 126/75  Pulse: 90  Weight: 249 lb 4.8 oz (113.1 kg)    Fetal Status:  Fetal Heart Rate (bpm): 148   Movement: Present    General: Alert, oriented and cooperative. Patient is in no acute distress.  Skin: Skin is warm and dry. No rash noted.   Cardiovascular: Normal heart rate noted  Respiratory: Normal respiratory effort, no problems with respiration noted  Abdomen: Soft, gravid, appropriate for gestational age.  Pain/Pressure: Present     Pelvic: Cervical exam deferred        Extremities: Normal range of motion.  Edema: None  Mental Status: Normal mood and affect. Normal behavior. Normal judgment and thought content.      06/04/2024    8:28 AM  Depression screen PHQ 2/9  Decreased Interest 0  Down, Depressed, Hopeless 0  PHQ - 2 Score 0  Altered sleeping 0  Tired, decreased energy 0  Change in appetite 0  Feeling bad or failure about yourself  0  Trouble concentrating 1  Moving slowly or fidgety/restless 0  Suicidal thoughts 0  PHQ-9 Score 1  Difficult doing work/chores Not difficult at all        06/04/2024    8:30 AM  GAD 7 : Generalized Anxiety Score  Nervous, Anxious, on Edge 1  Control/stop worrying 1  Worry  too much - different things 3  Trouble relaxing 0  Restless 1  Easily annoyed or irritable 1  Afraid - awful might happen 0  Total GAD 7 Score 7  Anxiety Difficulty Not difficult at all    Assessment and Plan:  Pregnancy: G1P0 at [redacted]w[redacted]d 1. Supervision of high risk pregnancy, antepartum, second trimester (Primary) - Frequent fetal movement - Genetic Screening results are pending, requested access to results. Patient is not interested in repeating the tests  2. [redacted] weeks gestation of pregnancy - Discussed swabs at next visit  3. Late prenatal care - Follow-up growth scan scheduled - initiated care @ ~ 24 weeks  4. Obesity in pregnancy, antepartum - recommend continued ASA therapy  Preterm labor symptoms and general obstetric precautions including but not limited to vaginal bleeding, contractions, leaking of fluid and fetal movement were reviewed in detail with the patient. Please refer to After Visit Summary for other counseling recommendations.   Return in about 2 weeks (around 07/18/2024) for LOB/GBS, IN-PERSON.  Future Appointments  Date Time Provider Department Center  07/18/2024  1:50 PM Delores Nidia CROME, FNP [facility] None  07/19/2024  1:15 PM [facility] PROVIDER 1 [facility]  07/19/2024  1:30 PM [facility] US4 [facility]    Nat Dauer, NP  "

## 2024-07-18 ENCOUNTER — Encounter: Payer: Self-pay | Admitting: Obstetrics and Gynecology

## 2024-07-18 ENCOUNTER — Other Ambulatory Visit (HOSPITAL_COMMUNITY)
Admission: RE | Admit: 2024-07-18 | Discharge: 2024-07-18 | Disposition: A | Source: Ambulatory Visit | Attending: Obstetrics and Gynecology | Admitting: Obstetrics and Gynecology

## 2024-07-18 ENCOUNTER — Ambulatory Visit: Payer: Self-pay | Admitting: Obstetrics and Gynecology

## 2024-07-18 VITALS — BP 133/76 | HR 96 | Wt 256.6 lb

## 2024-07-18 DIAGNOSIS — O0993 Supervision of high risk pregnancy, unspecified, third trimester: Secondary | ICD-10-CM

## 2024-07-18 DIAGNOSIS — O99213 Obesity complicating pregnancy, third trimester: Secondary | ICD-10-CM

## 2024-07-18 DIAGNOSIS — O0933 Supervision of pregnancy with insufficient antenatal care, third trimester: Secondary | ICD-10-CM

## 2024-07-18 DIAGNOSIS — Z3A36 36 weeks gestation of pregnancy: Secondary | ICD-10-CM | POA: Diagnosis not present

## 2024-07-18 DIAGNOSIS — Z3483 Encounter for supervision of other normal pregnancy, third trimester: Secondary | ICD-10-CM | POA: Insufficient documentation

## 2024-07-18 DIAGNOSIS — O99210 Obesity complicating pregnancy, unspecified trimester: Secondary | ICD-10-CM

## 2024-07-18 DIAGNOSIS — O0930 Supervision of pregnancy with insufficient antenatal care, unspecified trimester: Secondary | ICD-10-CM

## 2024-07-18 DIAGNOSIS — O0992 Supervision of high risk pregnancy, unspecified, second trimester: Secondary | ICD-10-CM

## 2024-07-18 NOTE — Progress Notes (Signed)
 36 week swabs  Pt has no concerns at this time.

## 2024-07-18 NOTE — Progress Notes (Signed)
" ° °  PRENATAL VISIT NOTE  Subjective:  Miranda Fernandez is a 29 y.o. G1P0 at [redacted]w[redacted]d being seen today for ongoing prenatal care.  She is currently monitored for the following issues for this low-risk pregnancy and has Obesity in pregnancy, antepartum; Late prenatal care; Supervision of high risk pregnancy, antepartum, second trimester; and Anemia on their problem list.  Patient reports no complaints.  Contractions: Not present. Vag. Bleeding: None.  Movement: Present. Denies leaking of fluid.   The following portions of the patient's history were reviewed and updated as appropriate: allergies, current medications, past family history, past medical history, past social history, past surgical history and problem list.   Objective:   Vitals:   07/18/24 1355  BP: 133/76  Pulse: 96  Weight: 256 lb 9.6 oz (116.4 kg)    Fetal Status:  Fetal Heart Rate (bpm): 145 Fundal Height: 38 cm Movement: Present Presentation: Vertex  General: Alert, oriented and cooperative. Patient is in no acute distress.  Skin: Skin is warm and dry. No rash noted.   Cardiovascular: Normal heart rate noted  Respiratory: Normal respiratory effort, no problems with respiration noted  Abdomen: Soft, gravid, appropriate for gestational age.  Pain/Pressure: Absent     Pelvic: Cervical exam performed in the presence of a chaperone Dilation: Fingertip Effacement (%): Thick Station: -4 (Ballotable)  Extremities: Normal range of motion.  Edema: None  Mental Status: Normal mood and affect. Normal behavior. Normal judgment and thought content.    Assessment and Plan:  Pregnancy: G1P0 at [redacted]w[redacted]d 1. Supervision of high risk pregnancy, antepartum, second trimester (Primary) BP and FHR normal Doing well, feeling regular movement    2. [redacted] weeks gestation of pregnancy -Swabs collected today -peds list provided -briefly discussed birth control options -labor precautions  - Culture, beta strep (group b only) - Cervicovaginal  ancillary only( [place])  3. Late prenatal care   4. Obesity in pregnancy, antepartum Follow up growth 1/22   Preterm labor symptoms and general obstetric precautions including but not limited to vaginal bleeding, contractions, leaking of fluid and fetal movement were reviewed in detail with the patient. Please refer to After Visit Summary for other counseling recommendations.   Return in about 1 week (around 07/25/2024) for OB VISIT (MD or APP).  Future Appointments  Date Time Provider Department Center  07/19/2024  1:15 PM [facility] PROVIDER 1 [facility]  07/19/2024  1:30 PM [facility] US4 [facility]    Nidia Daring, FNP  "

## 2024-07-18 NOTE — Patient Instructions (Signed)
 [facility] Pediatric Providers  [place] ([id number])  [facility]) - [facility], MD; Manson Passey, MD; Ave Filter, MD; Luna Fuse, MD; Kennedy Bucker, MD; Florestine Avers, MD; Melchor Amour, MD; Yetta Barre,  MD; Konrad Dolores, MD; Kathlene November, MD; Jenne Campus, MD; Wynetta Emery, MD; Duffy Rhody, MD; Gerre Couch, NP [address] [place], [place], [place] [address] [phone] Mon, Tue, Thur, Fri 8:30-5:30, Wed 9:30-5:30, Sat 8:30-12:30 Only accepting infants of first-time parents or siblings of current patients Hospital discharge coordinator will make follow-up appointment Medicaid - yes; Tricare - yes   [facility] - Pediatrics at [place]e Radon, MD; Sabino Dick, MD; Quitman Livings, MD; Betha Loa, NP; Claretha Cooper, MD; Lelon Perla, MD [address] [place] [place], [place] [address] [phone] Mon-Fri 8:30-5:30 Medicaid - yes, Tricare - yes  [place] [phone]) [facility], MD [address], [place], [place] [address] [phone] Mon, Tues, Wed Fri 8:30-5:00, Sat 8:30-12:00, Closed Thursdays Accepting siblings of established patients and first time mom's if you call prenatally Medicaid- yes; Tricare - yes   [facility] [address], [place], [place] [address] [phone] Mon-Fri 8:30-5:00 (lunch 12:00-1:00) Medicaid -Yes; Tricare - Yes   [facility] [place], MD; [place], [place]; [place], [place]; [place], [place] [address] [place] [address] [phone] Mon-Fri 7:30-5:30 Medicaid - Yes; Sherolyn Buba  [place] [phone] & [phone])  [facility], MD [address] [place], [place] [address] [phone] Mon-Thur 8:00-6:00, closed for lunch 12-2, closed Fridays Medicaid - yes; Tricare - no  [facility] Dareen Piano, NP; Cyndia Bent, MD; [place], [place]; [place], [place] [address], [place],  [place] [address] [phone] Mon-Fri 7:30-4:30 Medicaid - yes, Tricare - yes  [facility]  Juanito Doom, MD; Janene Harvey, NP; Vonita Moss, MD; Donn Pierini, NP [address], [place], [place] [address] [phone] Mon-Fri 8:30-5:00, closed for lunch 1-2, Sat 8:30-12:00 - sick visits only Providers come to see babies at [facility] Only accepting newborns of siblings and first time parents ONLY if who have met with office prior to delivery Medicaid -Yes; Tricare - yes  [facility] - [place], [place]; Spero Geralds, NP; Earlene Plater, MD; Lucretia Roers, MD:  [address], [place], [place] [address] [phone] Mon- Fri 8:00-5:00, Sat 9:00-12:00 - sick visits only Accepting siblings of established patients and first time mom/baby Medicaid - Yes; Tricare - yes Patients must have vaccinations (baby vaccines)   [employer] [phone])  [facility] Alfredo Bach, PA; [place], [place]; Eddie Candle, MD; Normand Sloop, MD; [place], NP; [place], DO; [place], [place]; Constance Goltz, MD; Ruthann Cancer, MD; Vear Clock, MD; [place], [place]; [place], [place]; [place], [place] [address] [facility] [address], [place], [place] [address] [phone] Mon-Fri 8:30-5:00, Sat 9:00-12:00 - sick only Please register online [email or web address] then schedule online or call office Medicaid-Yes; Tricare -yes   [facility] - [facility] at [place] [facility] [place] [place], [place]; List, FNP; Berneda Rose, MD; Luther Redo, PA-C; Lavonia Drafts, MD; Kellie Simmering, FNP; Genevie Cheshire, FNP; Evaristo Bury, MD; Berneda Rose, MD [phone] [address] [place], [place] [address] [phone] Mon-Fri 8:30-5:30 Medicaid - Yes; Tricare - yes  [facility] - [address]  [place], [place]; Whitney Post, MD; Hennie Duos, MD; Wynne Dust, MD; [place], NP [address], [place], [place] [address] [phone] Mon-Thur 8:00-5:30, Fri 8:00-5:00, Sat 9:00-12:00 Medicaid - yes, Tricare - yes

## 2024-07-19 ENCOUNTER — Ambulatory Visit (HOSPITAL_BASED_OUTPATIENT_CLINIC_OR_DEPARTMENT_OTHER): Admitting: Obstetrics

## 2024-07-19 ENCOUNTER — Ambulatory Visit: Attending: Maternal & Fetal Medicine

## 2024-07-19 ENCOUNTER — Ambulatory Visit: Payer: Self-pay | Admitting: Obstetrics and Gynecology

## 2024-07-19 VITALS — BP 120/61 | HR 73

## 2024-07-19 DIAGNOSIS — O403XX0 Polyhydramnios, third trimester, not applicable or unspecified: Secondary | ICD-10-CM | POA: Insufficient documentation

## 2024-07-19 DIAGNOSIS — E669 Obesity, unspecified: Secondary | ICD-10-CM

## 2024-07-19 DIAGNOSIS — O99213 Obesity complicating pregnancy, third trimester: Secondary | ICD-10-CM | POA: Diagnosis not present

## 2024-07-19 DIAGNOSIS — O99210 Obesity complicating pregnancy, unspecified trimester: Secondary | ICD-10-CM | POA: Insufficient documentation

## 2024-07-19 DIAGNOSIS — Z3A36 36 weeks gestation of pregnancy: Secondary | ICD-10-CM

## 2024-07-19 DIAGNOSIS — O0933 Supervision of pregnancy with insufficient antenatal care, third trimester: Secondary | ICD-10-CM

## 2024-07-19 LAB — CERVICOVAGINAL ANCILLARY ONLY
Bacterial Vaginitis (gardnerella): NEGATIVE
Candida Glabrata: NEGATIVE
Candida Vaginitis: NEGATIVE
Chlamydia: NEGATIVE
Comment: NEGATIVE
Comment: NEGATIVE
Comment: NEGATIVE
Comment: NEGATIVE
Comment: NEGATIVE
Comment: NORMAL
Neisseria Gonorrhea: NEGATIVE
Trichomonas: NEGATIVE

## 2024-07-19 NOTE — Progress Notes (Signed)
 MFM Consult Note  Miranda Fernandez is currently at [redacted]w[redacted]d. She has been followed due to maternal obesity with a BMI of 36 and polyhydramnios noted on her prior ultrasound exam.    She denies any problems since her last exam and has screened negative for gestational diabetes.    Her blood pressure today was 120/61.  Sonographic findings Single intrauterine pregnancy at 36w 1d.  Fetal cardiac activity:  Observed and appears normal. Presentation: Cephalic. Fetal biometry shows the estimated fetal weight of 6 lb 7 oz,  2908g (57%). Amniotic fluid volume: Within normal limits. AFI: 19.93cm.  MVP: 6.03 cm. Placenta: Anterior.  As the fetal growth is within normal limits today and the polyhydramnios has resolved, no further exams were scheduled in our office.    The patient was advised to continue routine prenatal care.  The patient stated that all of her questions were answered.   A total of 20 minutes was spent counseling and coordinating the care for this patient.  Greater than 50% of the time was spent in direct face-to-face contact.

## 2024-07-24 LAB — CULTURE, BETA STREP (GROUP B ONLY): Strep Gp B Culture: NEGATIVE

## 2024-07-25 ENCOUNTER — Encounter: Payer: Self-pay | Admitting: Physician Assistant

## 2024-07-25 NOTE — Progress Notes (Unsigned)
" ° °  PRENATAL VISIT NOTE  Subjective:  Miranda Fernandez is a 29 y.o. G1P0 at [redacted]w[redacted]d being seen today for ongoing prenatal care.  She is currently monitored for the following issues for this {Blank single:19197::high-risk,low-risk} pregnancy and has Obesity in pregnancy, antepartum; Late prenatal care; Supervision of high risk pregnancy, antepartum, second trimester; and Anemia on their problem list.  Patient reports {[id number]}.   .  .   . Denies leaking of fluid.   The following portions of the patient's history were reviewed and updated as appropriate: allergies, current medications, past family history, past medical history, past social history, past surgical history and problem list.   Objective:   There were no vitals filed for this visit.  Fetal Status:           General: Alert, oriented and cooperative. Patient is in no acute distress.  Skin: Skin is warm and dry. No rash noted.   Cardiovascular: Normal heart rate noted  Respiratory: Normal respiratory effort, no problems with respiration noted  Abdomen: Soft, gravid, appropriate for gestational age.        Pelvic: {Blank single:19197::Cervical exam performed in the presence of a chaperone,Cervical exam deferred}        Extremities: Normal range of motion.     Mental Status: Normal mood and affect. Normal behavior. Normal judgment and thought content.      06/04/2024    8:28 AM  Depression screen PHQ 2/9  Decreased Interest 0  Down, Depressed, Hopeless 0  PHQ - 2 Score 0  Altered sleeping 0  Tired, decreased energy 0  Change in appetite 0  Feeling bad or failure about yourself  0  Trouble concentrating 1  Moving slowly or fidgety/restless 0  Suicidal thoughts 0  PHQ-9 Score 1  Difficult doing work/chores Not difficult at all        06/04/2024    8:30 AM  GAD 7 : Generalized Anxiety Score  Nervous, Anxious, on Edge 1   Control/stop worrying 1   Worry too much - different things 3   Trouble relaxing 0    Restless 1   Easily annoyed or irritable 1   Afraid - awful might happen 0   Total GAD 7 Score 7  Anxiety Difficulty Not difficult at all     Data saved with a previous flowsheet row definition    Assessment and Plan:  Pregnancy: G1P0 at [redacted]w[redacted]d  1. Supervision of high risk pregnancy, antepartum, second trimester (Primary) ***  2. [redacted] weeks gestation of pregnancy   3. Anemia, unspecified type ***  4. BMI 35.0-35.9,adult ***  5. Late prenatal care ***  {Blank single:19197::Term,Preterm} labor symptoms and general obstetric precautions including but not limited to vaginal bleeding, contractions, leaking of fluid and fetal movement were reviewed in detail with the patient.  Please refer to After Visit Summary for other counseling recommendations.   No follow-ups on file.  Future Appointments  Date Time Provider Department Center  07/25/2024 10:15 AM Stefani Baik E, PA-C [facility] None    Rhylee Pucillo E Guadalupe Nickless, PA-C  "

## 2024-07-31 ENCOUNTER — Ambulatory Visit: Admitting: Obstetrics

## 2024-07-31 VITALS — BP 117/76 | HR 73 | Wt 260.9 lb

## 2024-07-31 DIAGNOSIS — D508 Other iron deficiency anemias: Secondary | ICD-10-CM

## 2024-07-31 DIAGNOSIS — O0930 Supervision of pregnancy with insufficient antenatal care, unspecified trimester: Secondary | ICD-10-CM

## 2024-07-31 DIAGNOSIS — O0992 Supervision of high risk pregnancy, unspecified, second trimester: Secondary | ICD-10-CM

## 2024-07-31 DIAGNOSIS — O99210 Obesity complicating pregnancy, unspecified trimester: Secondary | ICD-10-CM

## 2024-07-31 NOTE — Progress Notes (Unsigned)
 Pt presents for rob. Pt has no questions or concerns at this time.

## 2024-08-08 ENCOUNTER — Encounter: Payer: Self-pay | Admitting: Obstetrics

## 2024-08-09 ENCOUNTER — Encounter: Payer: Self-pay | Admitting: Obstetrics & Gynecology

## 2024-08-13 ENCOUNTER — Encounter: Admitting: Obstetrics and Gynecology
